# Patient Record
Sex: Male | Born: 1990
Health system: Southern US, Community
[De-identification: ages and names within clinical notes are randomized; demographics above are authoritative.]

## PROBLEM LIST (undated history)

## (undated) DIAGNOSIS — R112 Nausea with vomiting, unspecified: Secondary | ICD-10-CM

## (undated) DIAGNOSIS — K219 Gastro-esophageal reflux disease without esophagitis: Secondary | ICD-10-CM

## (undated) DIAGNOSIS — R109 Unspecified abdominal pain: Secondary | ICD-10-CM

## (undated) DIAGNOSIS — R142 Eructation: Secondary | ICD-10-CM

## (undated) DIAGNOSIS — R197 Diarrhea, unspecified: Secondary | ICD-10-CM

## (undated) HISTORY — DX: Gastro-esophageal reflux disease without esophagitis: K21.9

## (undated) HISTORY — DX: Nausea with vomiting, unspecified: R11.2

## (undated) HISTORY — DX: Eructation: R14.2

## (undated) HISTORY — DX: Unspecified abdominal pain: R10.9

## (undated) HISTORY — DX: Diarrhea, unspecified: R19.7

---

## 2011-04-30 ENCOUNTER — Other Ambulatory Visit: Payer: Self-pay | Admitting: Family Medicine

## 2011-04-30 ENCOUNTER — Ambulatory Visit
Admission: RE | Admit: 2011-04-30 | Discharge: 2011-04-30 | Disposition: A | Discharge status: Home / Self Care | Payer: BC Managed Care – PPO | Source: Ambulatory Visit | Attending: Family Medicine | Admitting: Family Medicine

## 2011-04-30 DIAGNOSIS — N50811 Right testicular pain: Secondary | ICD-10-CM

## 2011-05-06 ENCOUNTER — Encounter (INDEPENDENT_AMBULATORY_CARE_PROVIDER_SITE_OTHER): Payer: Self-pay | Admitting: Surgery

## 2011-05-10 ENCOUNTER — Ambulatory Visit (INDEPENDENT_AMBULATORY_CARE_PROVIDER_SITE_OTHER): Payer: BC Managed Care – PPO | Admitting: Surgery

## 2012-01-22 IMAGING — US US ART/VEN ABD/PELV/SCROTUM DOPPLER LTD
1 series · 14 of 25 positions shown · non-contrast
Comparison: None.

CLINICAL DATA: Right testicular pain

SCROTAL ULTRASOUND
DOPPLER ULTRASOUND OF THE TESTICLES
TECHNIQUE: Complete ultrasound examination of the testicles,
epididymis, and other scrotal structures was performed.  Color and
spectral Doppler ultrasound were also utilized to evaluate blood
flow to the testicles.

[Series 1: us art/ven abd/pelv/scrotum doppler ltd · 14 of 34 slices shown]
[im 1/34]
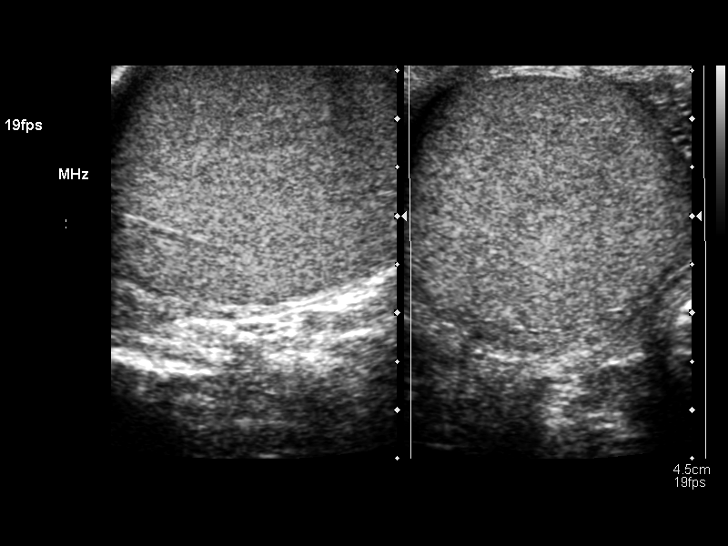
[im 3/34]
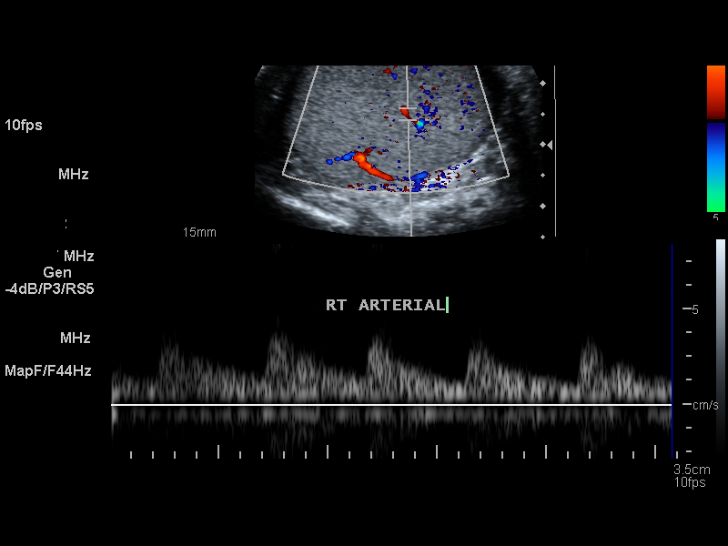
[im 6/34]
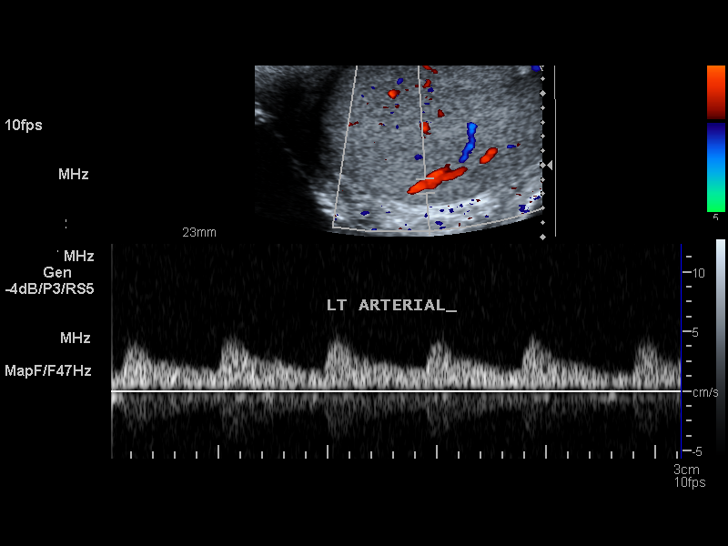
[im 9/34]
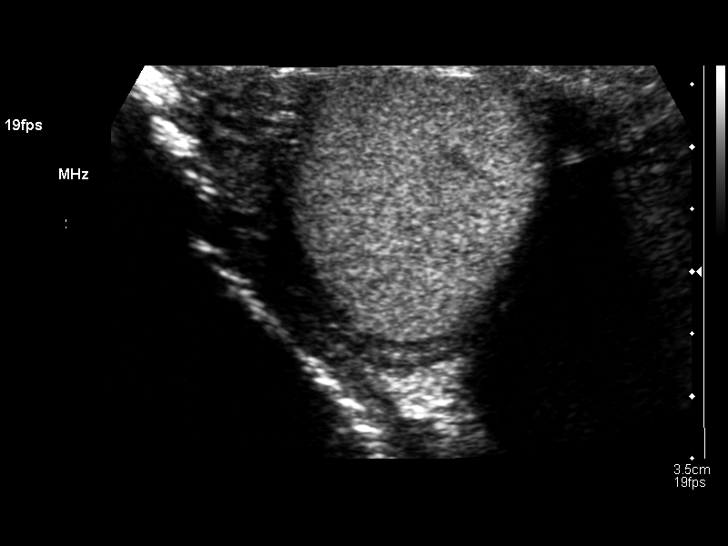
[im 12/34]
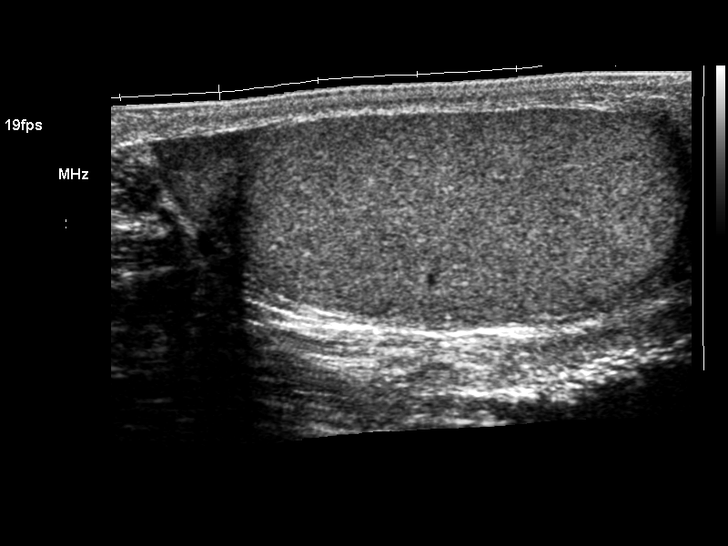
[im 13/34]
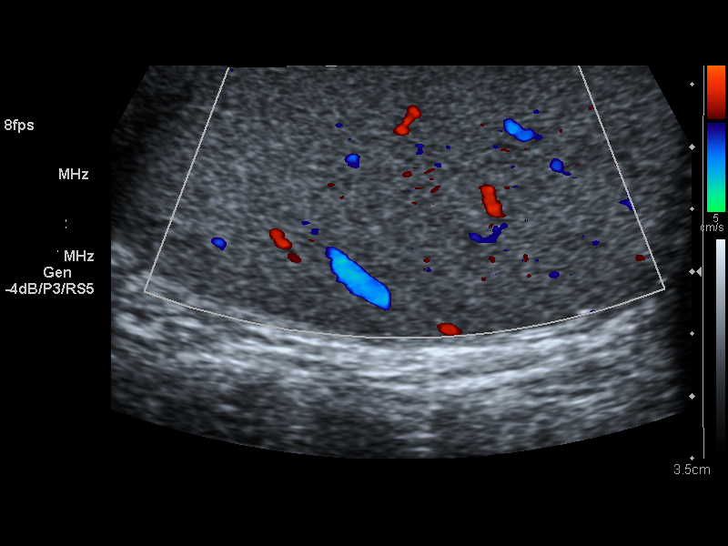
[im 16/34]
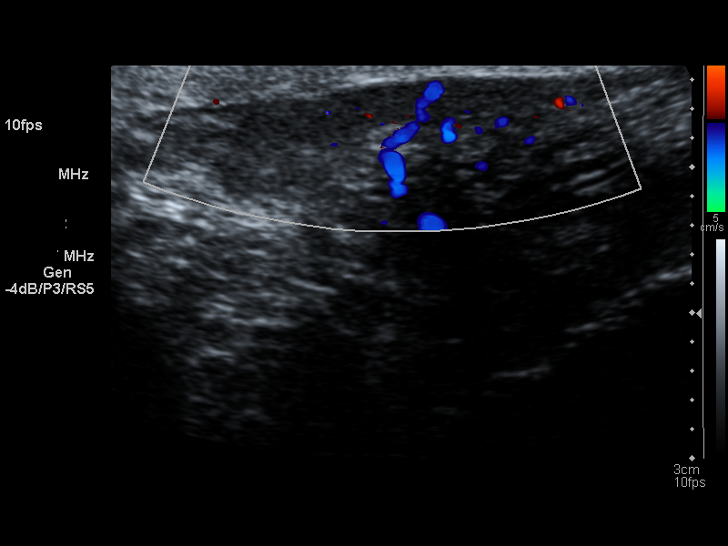
[im 18/34]
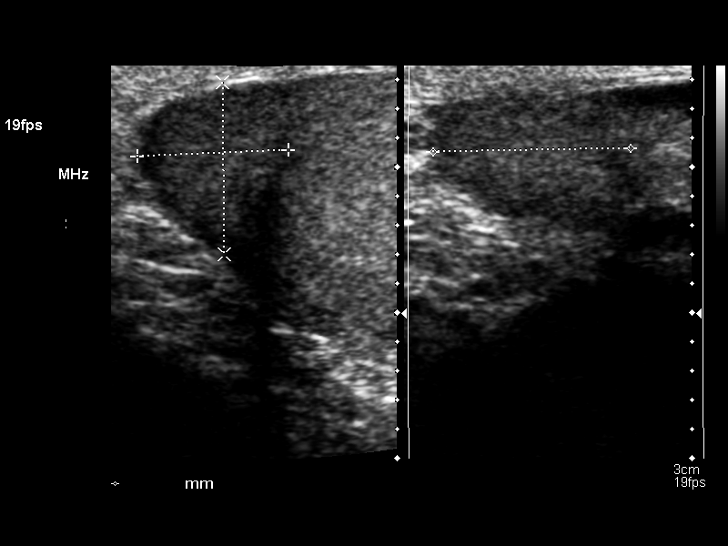
[im 21/34]
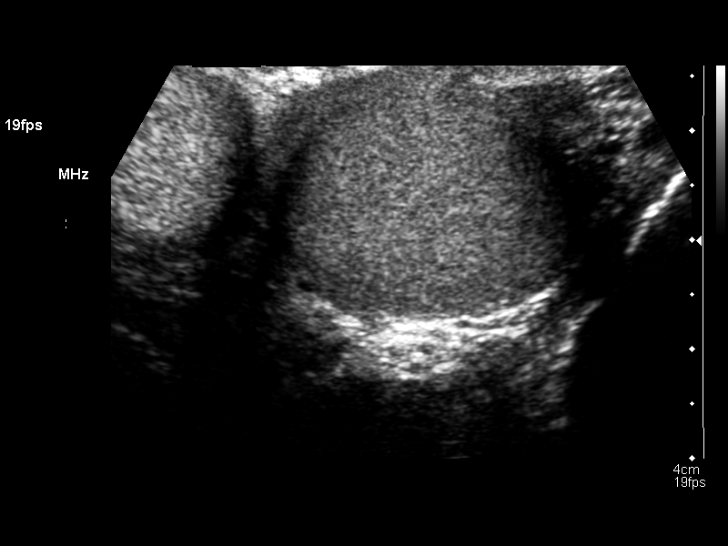
[im 23/34]
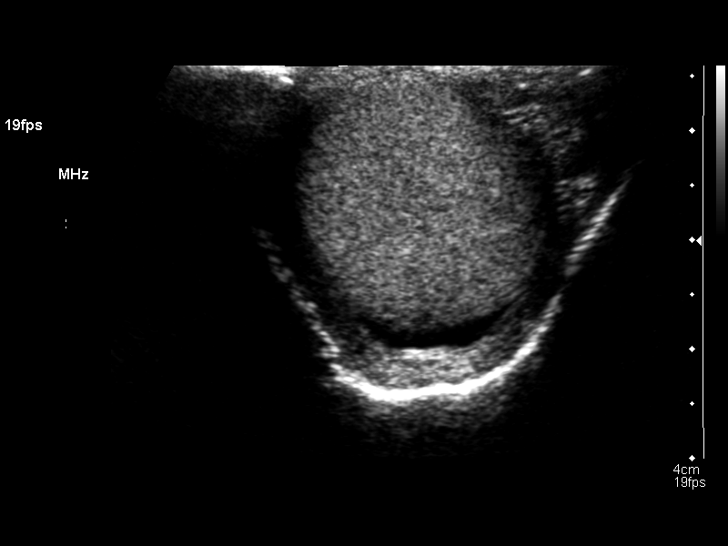
[im 25/34]
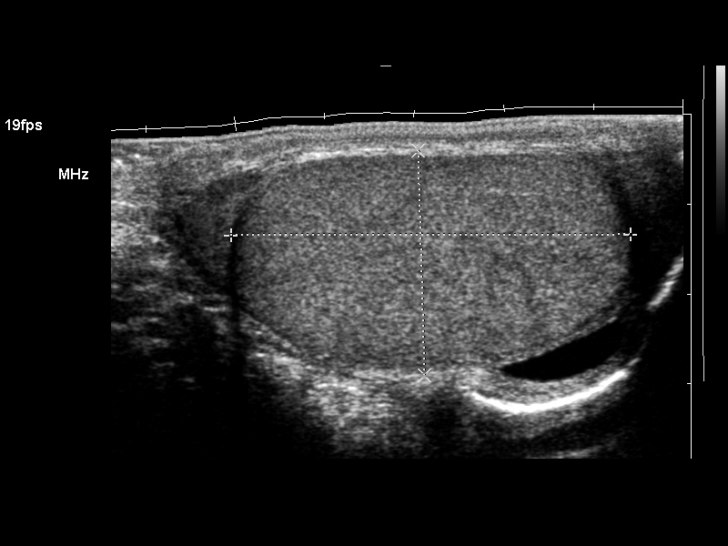
[im 28/34]
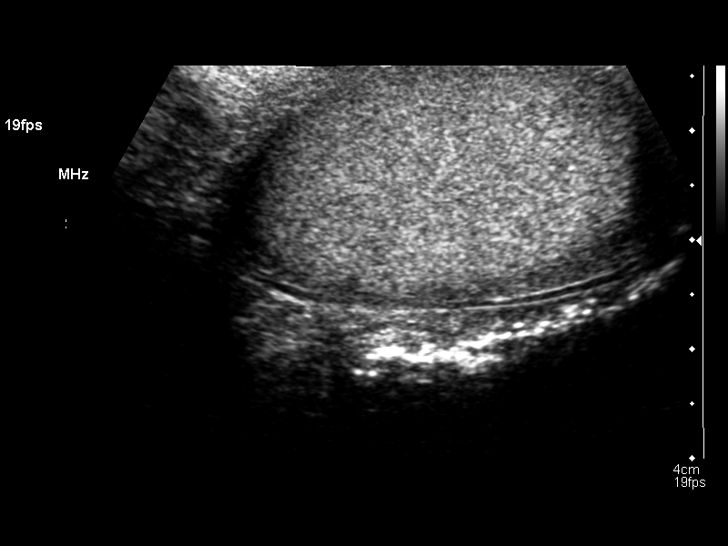
[im 31/34]
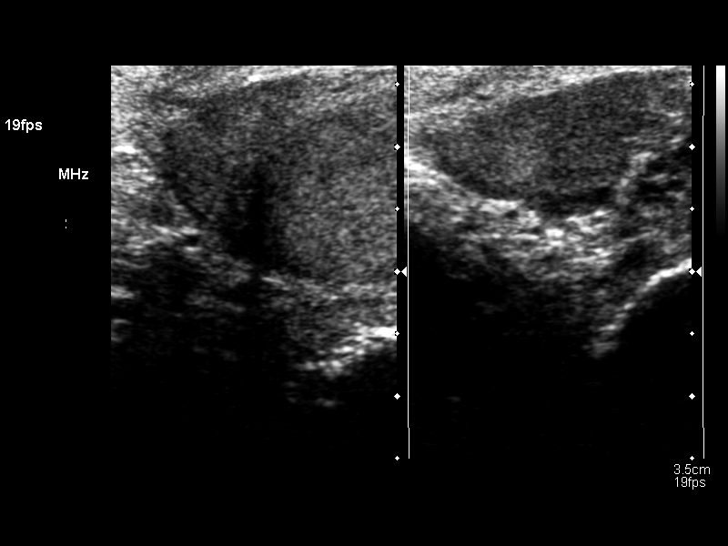
[im 34/34]
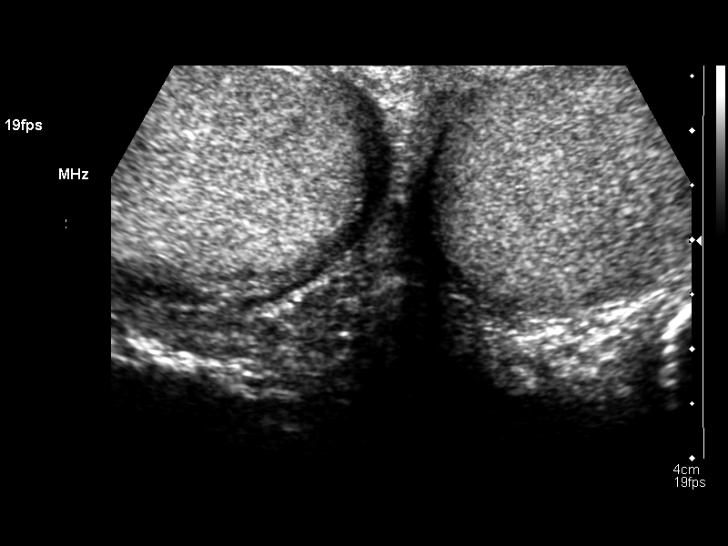

[14 of 25 positions shown; findings below may reference images not displayed]

FINDINGS: Right testis:  Normal in size and echotexture measuring
approximately 4.8 x 2.3 x 3.3 cm.  No focal parenchymal
abnormality.  Normal color Doppler flow without hyperemia

Left testis:  Normal in size and echotexture measuring
approximately 4.5 x 2.5 x 3.2 cm.  No focal parenchymal
abnormality.  Normal color Doppler flow without hyperemia.

Right epididymis:  Normal in size and appearance without hyperemia.

Left epididymis:  Normal in size and appearance without hyperemia.

Hydocele:  Absent.

Varicocele:  Absent.

Pulsed Doppler interrogation of both testes demonstrates normal
arterial and venous waveforms arising from both testicles.
IMPRESSION: Normal scrotal ultrasound.  Specifically, no evidence of testicular
torsion or epididymo-orchitis.

## 2013-03-25 ENCOUNTER — Ambulatory Visit: Payer: Self-pay | Admitting: Family Medicine

## 2013-08-04 ENCOUNTER — Ambulatory Visit: Payer: BC Managed Care – PPO

## 2013-08-04 DIAGNOSIS — Z23 Encounter for immunization: Secondary | ICD-10-CM

## 2014-01-10 ENCOUNTER — Ambulatory Visit (INDEPENDENT_AMBULATORY_CARE_PROVIDER_SITE_OTHER): Payer: BC Managed Care – PPO | Admitting: Family Medicine

## 2014-01-10 VITALS — BP 134/83 | HR 60 | Temp 99.1°F | Ht 69.0 in | Wt 146.4 lb

## 2014-01-10 DIAGNOSIS — K2970 Gastritis, unspecified, without bleeding: Secondary | ICD-10-CM

## 2014-01-10 DIAGNOSIS — K2990 Gastroduodenitis, unspecified, without bleeding: Secondary | ICD-10-CM

## 2014-01-10 MED ORDER — OMEPRAZOLE 20 MG PO CPDR: 20.0000 mg | DELAYED_RELEASE_CAPSULE | Freq: Every day | ORAL | Status: DC

## 2014-01-10 MED ORDER — SUCRALFATE 1 G PO TABS: 1.0000 g | ORAL_TABLET | Freq: Three times a day (TID) | ORAL | Status: DC

## 2014-01-10 NOTE — Progress Notes (Signed)
   Subjective:    Patient ID: Victor CrazeMichael Reynolds, male    DOB: [birth year], 23 y.o.   MRN: [id number]  HPI  This 23 y.o. male presents for evaluation of abdominal discomfort and GERD sx's.  He was rx'd  Some prilosec and he is still having some discomfort.  He states he drinks occasionally and he is trying to quit smoking.  Review of Systems C/o gerd and abdominal pain. No chest pain, SOB, HA, dizziness, vision change, N/V, diarrhea, constipation, dysuria, urinary urgency or frequency, myalgias, arthralgias or rash.     Objective:   Physical Exam Vital signs noted  Well developed well nourished male.  HEENT - Head atraumatic Normocephalic                Eyes - PERRLA, Conjuctiva - clear Sclera- Clear EOMI                Ears - EAC's Wnl TM's Wnl Gross Hearing WNL Respiratory - Lungs CTA bilateral Cardiac - RRR S1 and S2 without murmur GI - Abdomen tender in epigastric region  Extremities - No edema. Neuro - Grossly intact.       Assessment & Plan:  Gastritis - Plan: sucralfate (CARAFATE) 1 G tablet AC and HS for 2 weeks.  Gerd - Prilosec 20mg  one po qd.  Consider GI referral if not better.  Victor CanterWilliam J Shalandra Leu FNP

## 2014-01-12 ENCOUNTER — Other Ambulatory Visit: Payer: Self-pay | Admitting: *Deleted

## 2014-01-12 ENCOUNTER — Encounter: Payer: Self-pay | Admitting: Internal Medicine

## 2014-01-12 DIAGNOSIS — R1013 Epigastric pain: Secondary | ICD-10-CM

## 2014-01-13 ENCOUNTER — Other Ambulatory Visit (INDEPENDENT_AMBULATORY_CARE_PROVIDER_SITE_OTHER): Payer: BC Managed Care – PPO

## 2014-01-13 DIAGNOSIS — R1013 Epigastric pain: Secondary | ICD-10-CM

## 2014-01-13 LAB — POCT CBC
GRANULOCYTE PERCENT: 58.9 % (ref 37–80)
HCT, POC: 41.7 % — AB (ref 43.5–53.7)
Hemoglobin: 13.5 g/dL — AB (ref 14.1–18.1)
Lymph, poc: 3 (ref 0.6–3.4)
MCH, POC: 28.5 pg (ref 27–31.2)
MCHC: 32.4 g/dL (ref 31.8–35.4)
MCV: 87.9 fL (ref 80–97)
MPV: 8.9 fL (ref 0–99.8)
POC GRANULOCYTE: 4.8 (ref 2–6.9)
POC LYMPH %: 36.9 % (ref 10–50)
Platelet Count, POC: 162 10*3/uL (ref 142–424)
RBC: 4.7 M/uL (ref 4.69–6.13)
RDW, POC: 12.5 %
WBC: 8.2 10*3/uL (ref 4.6–10.2)

## 2014-01-13 NOTE — Progress Notes (Signed)
Pt came in for lab  only 

## 2014-01-15 LAB — HEPATIC FUNCTION PANEL
ALT: 18 IU/L (ref 0–44)
AST: 21 IU/L (ref 0–40)
Albumin: 5.1 g/dL (ref 3.5–5.5)
Alkaline Phosphatase: 70 IU/L (ref 39–117)
Bilirubin, Direct: 0.22 mg/dL (ref 0.00–0.40)
TOTAL PROTEIN: 6.9 g/dL (ref 6.0–8.5)
Total Bilirubin: 1.1 mg/dL (ref 0.0–1.2)

## 2014-01-15 LAB — H PYLORI, IGM, IGG, IGA AB: H. pylori, IgA Abs: <9 units (ref 0.0–8.9)

## 2014-01-17 ENCOUNTER — Ambulatory Visit (HOSPITAL_COMMUNITY): Payer: BC Managed Care – PPO

## 2014-01-18 ENCOUNTER — Other Ambulatory Visit: Payer: BC Managed Care – PPO

## 2014-01-18 DIAGNOSIS — Z1212 Encounter for screening for malignant neoplasm of rectum: Secondary | ICD-10-CM

## 2014-01-19 ENCOUNTER — Ambulatory Visit (HOSPITAL_COMMUNITY)
Admission: RE | Admit: 2014-01-19 | Discharge: 2014-01-19 | Disposition: A | Discharge status: Home / Self Care | Payer: BC Managed Care – PPO | Source: Ambulatory Visit | Attending: Family Medicine | Admitting: Family Medicine

## 2014-01-19 DIAGNOSIS — R1013 Epigastric pain: Secondary | ICD-10-CM

## 2014-01-20 LAB — FECAL OCCULT BLOOD, IMMUNOCHEMICAL: Fecal Occult Bld: NEGATIVE

## 2014-01-21 ENCOUNTER — Ambulatory Visit: Payer: BC Managed Care – PPO | Admitting: Family Medicine

## 2014-01-24 ENCOUNTER — Encounter: Payer: Self-pay | Admitting: *Deleted

## 2014-01-24 NOTE — Progress Notes (Signed)
Quick Note:  Copy of labs sent to patient ______ 

## 2014-03-03 ENCOUNTER — Encounter: Payer: Self-pay | Admitting: Internal Medicine

## 2014-03-04 ENCOUNTER — Ambulatory Visit: Payer: BC Managed Care – PPO | Admitting: Internal Medicine

## 2014-08-01 ENCOUNTER — Ambulatory Visit (INDEPENDENT_AMBULATORY_CARE_PROVIDER_SITE_OTHER): Payer: BC Managed Care – PPO | Admitting: Nurse Practitioner

## 2014-08-01 ENCOUNTER — Telehealth: Payer: Self-pay | Admitting: Family Medicine

## 2014-08-01 ENCOUNTER — Encounter: Payer: Self-pay | Admitting: Nurse Practitioner

## 2014-08-01 VITALS — BP 123/77 | HR 62 | Temp 98.7°F | Ht 69.0 in | Wt 146.6 lb

## 2014-08-01 DIAGNOSIS — J019 Acute sinusitis, unspecified: Secondary | ICD-10-CM

## 2014-08-01 MED ORDER — AZITHROMYCIN 250 MG PO TABS: ORAL_TABLET | ORAL | Status: DC

## 2014-08-01 NOTE — Telephone Encounter (Signed)
Appt given for today 

## 2014-08-01 NOTE — Patient Instructions (Signed)

## 2014-08-01 NOTE — Progress Notes (Signed)
   Subjective:    Patient ID: Willia CrazeMichael Chea, male    DOB: [birth year], 23 y.o.   MRN: [id number]  HPI Patient here today c/o cough and congestion that started 2 days ago- OTC meds no good- no fever.    Review of Systems  Constitutional: Negative.  Negative for fever, chills and appetite change.  HENT: Positive for congestion, postnasal drip, rhinorrhea and sinus pressure.   Respiratory: Positive for cough.   Cardiovascular: Negative.   Gastrointestinal: Negative.   Genitourinary: Negative.   Neurological: Negative.   Psychiatric/Behavioral: Negative.   All other systems reviewed and are negative.      Objective:   Physical Exam  Constitutional: He is oriented to person, place, and time. He appears well-developed and well-nourished. He appears distressed.  HENT:  Right Ear: Hearing, tympanic membrane, external ear and ear canal normal.  Left Ear: Hearing, tympanic membrane, external ear and ear canal normal.  Nose: Mucosal edema and rhinorrhea present. Right sinus exhibits maxillary sinus tenderness. Right sinus exhibits no frontal sinus tenderness. Left sinus exhibits maxillary sinus tenderness. Left sinus exhibits no frontal sinus tenderness.  Mouth/Throat: Uvula is midline, oropharynx is clear and moist and mucous membranes are normal.  Eyes: Pupils are equal, round, and reactive to light.  Neck: Normal range of motion. Neck supple.  Cardiovascular: Normal rate.   Pulmonary/Chest: Effort normal and breath sounds normal.  Abdominal: Soft. Bowel sounds are normal.  Neurological: He is alert and oriented to person, place, and time.  Skin: Skin is warm.  Psychiatric: He has a normal mood and affect. His behavior is normal. Judgment and thought content normal.  BP 123/77  Pulse 62  Temp(Src) 98.7 F (37.1 C) (Oral)  Ht 5\' 9"  (1.753 m)  Wt 146 lb 9.6 oz (66.497 kg)  BMI 21.64 kg/m2         Assessment & Plan:   1. Acute rhinosinusitis    Meds ordered this encounter    Medications  . azithromycin (ZITHROMAX Z-PAK) 250 MG tablet    Sig: As directed    Dispense:  6 each    Refill:  0    Order Specific Question:  Supervising Provider    Answer:  Ernestina PennaMOORE, DONALD W [[id number]]   1. Take meds as prescribed 2. Use a cool mist humidifier especially during the winter months and when heat has been humid. 3. Use saline nose sprays frequently 4. Saline irrigations of the nose can be very helpful if done frequently.  * 4X daily for 1 week*  * Use of a nettie pot can be helpful with this. Follow directions with this* 5. Drink plenty of fluids 6. Keep thermostat turn down low 7.For any cough or congestion  Use plain Mucinex- regular strength or max strength is fine   * Children- consult with Pharmacist for dosing 8. For fever or aces or pains- take tylenol or ibuprofen appropriate for age and weight.  * for fevers greater than 101 orally you may alternate ibuprofen and tylenol every  3 hours.   Mary-Margaret Daphine DeutscherMartin, FNP

## 2014-08-15 ENCOUNTER — Ambulatory Visit (INDEPENDENT_AMBULATORY_CARE_PROVIDER_SITE_OTHER): Payer: BC Managed Care – PPO | Admitting: *Deleted

## 2014-08-15 ENCOUNTER — Ambulatory Visit: Payer: BC Managed Care – PPO

## 2014-08-15 DIAGNOSIS — Z23 Encounter for immunization: Secondary | ICD-10-CM

## 2015-05-24 ENCOUNTER — Encounter: Payer: Self-pay | Admitting: *Deleted

## 2015-05-24 ENCOUNTER — Ambulatory Visit: Payer: Self-pay | Admitting: Family Medicine

## 2015-05-24 ENCOUNTER — Encounter (INDEPENDENT_AMBULATORY_CARE_PROVIDER_SITE_OTHER): Payer: Self-pay

## 2015-08-01 ENCOUNTER — Ambulatory Visit (INDEPENDENT_AMBULATORY_CARE_PROVIDER_SITE_OTHER): Payer: BLUE CROSS/BLUE SHIELD | Admitting: *Deleted

## 2015-08-01 DIAGNOSIS — Z23 Encounter for immunization: Secondary | ICD-10-CM | POA: Diagnosis not present

## 2015-09-04 ENCOUNTER — Encounter (INDEPENDENT_AMBULATORY_CARE_PROVIDER_SITE_OTHER): Payer: Self-pay

## 2015-09-04 ENCOUNTER — Encounter: Payer: Self-pay | Admitting: Family Medicine

## 2015-09-04 ENCOUNTER — Ambulatory Visit (INDEPENDENT_AMBULATORY_CARE_PROVIDER_SITE_OTHER): Payer: BLUE CROSS/BLUE SHIELD | Admitting: Family Medicine

## 2015-09-04 VITALS — BP 122/76 | HR 64 | Temp 97.3°F | Ht 69.0 in | Wt 150.2 lb

## 2015-09-04 DIAGNOSIS — J0100 Acute maxillary sinusitis, unspecified: Secondary | ICD-10-CM | POA: Insufficient documentation

## 2015-09-04 DIAGNOSIS — J0101 Acute recurrent maxillary sinusitis: Secondary | ICD-10-CM

## 2015-09-04 MED ORDER — AMOXICILLIN-POT CLAVULANATE 875-125 MG PO TABS: 1.0000 | ORAL_TABLET | Freq: Two times a day (BID) | ORAL | Status: DC

## 2015-09-04 MED ORDER — MOMETASONE FUROATE 50 MCG/ACT NA SUSP: 2.0000 | Freq: Every day | NASAL | Status: DC

## 2015-09-04 NOTE — Patient Instructions (Signed)
Great to meet you!  Come back if you get worse or don't get better as expected  Eat yogurt daily with this medicine.   Sinusitis, Adult Sinusitis is redness, soreness, and puffiness (inflammation) of the air pockets in the bones of your face (sinuses). The redness, soreness, and puffiness can cause air and mucus to get trapped in your sinuses. This can allow germs to grow and cause an infection.  HOME CARE   Drink enough fluids to keep your pee (urine) clear or pale yellow.  Use a humidifier in your home.  Run a hot shower to create steam in the bathroom. Sit in the bathroom with the door closed. Breathe in the steam 3-4 times a day.  Put a warm, moist washcloth on your face 3-4 times a day, or as told by your doctor.  Use salt water sprays (saline sprays) to wet the thick fluid in your nose. This can help the sinuses drain.  Only take medicine as told by your doctor. GET HELP RIGHT AWAY IF:   Your pain gets worse.  You have very bad headaches.  You are sick to your stomach (nauseous).  You throw up (vomit).  You are very sleepy (drowsy) all the time.  Your face is puffy (swollen).  Your vision changes.  You have a stiff neck.  You have trouble breathing. MAKE SURE YOU:   Understand these instructions.  Will watch your condition.  Will get help right away if you are not doing well or get worse.   This information is not intended to replace advice given to you by your health care provider. Make sure you discuss any questions you have with your health care provider.   Document Released: 03/11/2008 Document Revised: 10/14/2014 Document Reviewed: 04/28/2012 Elsevier Interactive Patient Education [employer]2016 Elsevier Inc.

## 2015-09-04 NOTE — Progress Notes (Signed)
   HPI  Patient presents today . Acute illness, concern for sinus infection.  Patient explains he has had 3 days of cough, congestion, sinus pressure, and headache. He states he gets these approximately once a year over the last 3-4 years. He has mild dyspnea He does have malaise No fevers He has a normal appetite and oral tolerance. He missed work today due to malaise  PMH: Smoking status noted ROS: Per HPI  Objective: BP 122/76 mmHg  Pulse 64  Temp(Src) 97.3 F (36.3 C) (Oral)  Ht 5\' 9"  (1.753 m)  Wt 150 lb 3.2 oz (68.13 kg)  BMI 22.17 kg/m2 Gen: NAD, alert, cooperative with exam HEENT: NCAT, TMs normal bilaterally, nares with some swelling, oropharynx clear, some tenderness to palpation of bilateral maxillary sinuses Neck: A few small nontender swollen lymph nodes CV: RRR, good S1/S2, no murmur Resp:  Expiratory wheezes Ext: No edema, warm Neuro: Alert and oriented, No gross deficits  Assessment and plan:  # Acute maxillary sinusitis treating with Augmentin Also start Flonase to prevent recurrence Discussed supportive care, note written for work excuse    Meds ordered this encounter  Medications  . amoxicillin-clavulanate (AUGMENTIN) 875-125 MG tablet    Sig: Take 1 tablet by mouth 2 (two) times daily.    Dispense:  20 tablet    Refill:  0  . mometasone (NASONEX) 50 MCG/ACT nasal spray    Sig: Place 2 sprays into the nose daily.    Dispense:  17 g    Refill:  12    Murtis SinkSam Kansas Spainhower, MD [facility] 09/04/2015, 4:52 PM

## 2016-10-03 ENCOUNTER — Ambulatory Visit (INDEPENDENT_AMBULATORY_CARE_PROVIDER_SITE_OTHER): Payer: BLUE CROSS/BLUE SHIELD | Admitting: Pediatrics

## 2016-10-03 ENCOUNTER — Encounter: Payer: Self-pay | Admitting: Pediatrics

## 2016-10-03 VITALS — BP 129/75 | HR 82 | Temp 97.7°F | Ht 69.0 in | Wt 144.2 lb

## 2016-10-03 DIAGNOSIS — K529 Noninfective gastroenteritis and colitis, unspecified: Secondary | ICD-10-CM | POA: Diagnosis not present

## 2016-10-03 NOTE — Progress Notes (Signed)
  Subjective:   Patient ID: Victor Reynolds, male    DOB: September 16, 1991, 25 y.o.   MRN: [id number] CC: GI upset (started last PM, vomitting some diarrhea, feels much better now)  HPI: Victor CrazeMichael Decarlo is a 25 y.o. male presenting for GI upset (started last PM, vomitting some diarrhea, feels much better now)  Feeling normal now Needs note for work Started getting sick last night Last time vomited about 5am this morning, total 3-4 times No blood, light yellow phlegm Just one loose stool, normal brown, had normal stool today Eating and tolerating food fine now No other sick contacts Not eaten any unusual foods Does not work with food  Relevant past medical, surgical, family and social history reviewed. Allergies and medications reviewed and updated. History  Smoking Status  . Current Every Day Smoker  Smokeless Tobacco  . Not on file   ROS: Per HPI   Objective:    BP 129/75 (BP Location: Left Arm, Patient Position: Sitting, Cuff Size: Normal)   Pulse 82   Temp 97.7 F (36.5 C) (Oral)   Ht 5\' 9"  (1.753 m)   Wt 144 lb 3.2 oz (65.4 kg)   BMI 21.29 kg/m   Wt Readings from Last 3 Encounters:  10/03/16 144 lb 3.2 oz (65.4 kg)  09/04/15 150 lb 3.2 oz (68.1 kg)  08/01/14 146 lb 9.6 oz (66.5 kg)    Gen: NAD, alert, cooperative with exam, NCAT EYES: EOMI, no conjunctival injection, or no icterus CV: NRRR, normal S1/S2, no murmur, distal pulses 2+ b/l Resp: CTABL, no wheezes, normal WOB Abd: +BS, soft, NTND. no guarding or organomegaly Ext: No edema, warm Neuro: Alert and oriented, strength equal b/l UE and LE, coordination grossly normal MSK: normal muscle bulk  Assessment & Plan:  Casimiro NeedleMichael was seen today for gi upset.  Diagnoses and all orders for this visit:  Gastroenteritis Feels back to normal self Gave note for work Let us know if sx return  Follow up plan: prn Rex Krasarol Vincent, MD [facility]

## 2017-02-19 ENCOUNTER — Ambulatory Visit: Payer: BLUE CROSS/BLUE SHIELD | Admitting: Pediatrics

## 2017-03-07 ENCOUNTER — Ambulatory Visit (INDEPENDENT_AMBULATORY_CARE_PROVIDER_SITE_OTHER): Payer: BLUE CROSS/BLUE SHIELD | Admitting: Family Medicine

## 2017-03-07 ENCOUNTER — Encounter: Payer: Self-pay | Admitting: Family Medicine

## 2017-03-07 VITALS — BP 113/82 | HR 85 | Temp 98.2°F | Ht 69.0 in | Wt 135.0 lb

## 2017-03-07 DIAGNOSIS — A084 Viral intestinal infection, unspecified: Secondary | ICD-10-CM

## 2017-03-07 MED ORDER — ONDANSETRON 4 MG PO TBDP: 4.0000 mg | ORAL_TABLET | Freq: Three times a day (TID) | ORAL | 0 refills | Status: DC | PRN

## 2017-03-07 NOTE — Progress Notes (Signed)
BP 113/82   Pulse 85   Temp 98.2 F (36.8 C) (Oral)   Ht 5\' 9"  (1.753 m)   Wt 135 lb (61.2 kg)   BMI 19.94 kg/m    Subjective:    Patient ID: Victor Reynolds, male    DOB: 11/27/1990, 26 y.o.   MRN: [id number]  HPI: Victor Reynolds is a 26 y.o. male presenting on 03/07/2017 for Vomiting (last night; had eaten some greasy food several hours earlier)   HPI Nausea and vomiting and abdominal pain Patient woke up last night around 0100 with nausea and vomiting and abdominal pain. Last night he did eat at the bar and had a couple drinks but nothing more than he usually does and he had a [place] cheese steak which she has had before at a bar that he goes frequently. He denies anybody else getting ill or sick from the bar. He denies any sick contacts from work that he knows of either. He saw some red chunks in his vomit and was concerned that they might be blood but he has not had any vomiting yet since but has been more fatigued and sleepy all day today. He has had a looser stool but no diarrhea and denies any blood in his stool.  Relevant past medical, surgical, family and social history reviewed and updated as indicated. Interim medical history since our last visit reviewed. Allergies and medications reviewed and updated.  Review of Systems  Constitutional: Negative for chills and fever.  Respiratory: Negative for shortness of breath and wheezing.   Cardiovascular: Negative for chest pain and leg swelling.  Gastrointestinal: Positive for abdominal pain, nausea and vomiting. Negative for anal bleeding, blood in stool, constipation and diarrhea.  Musculoskeletal: Negative for back pain and gait problem.  Skin: Negative for rash.  All other systems reviewed and are negative.   Per HPI unless specifically indicated above        Objective:    BP 113/82   Pulse 85   Temp 98.2 F (36.8 C) (Oral)   Ht 5\' 9"  (1.753 m)   Wt 135 lb (61.2 kg)   BMI 19.94 kg/m   Wt Readings from Last 3  Encounters:  03/07/17 135 lb (61.2 kg)  10/03/16 144 lb 3.2 oz (65.4 kg)  09/04/15 150 lb 3.2 oz (68.1 kg)    Physical Exam  Constitutional: He is oriented to person, place, and time. He appears well-developed and well-nourished. No distress.  Eyes: Conjunctivae are normal. No scleral icterus.  Cardiovascular: Normal rate, regular rhythm, normal heart sounds and intact distal pulses.   No murmur heard. Pulmonary/Chest: Effort normal and breath sounds normal. No respiratory distress. He has no wheezes. He has no rales.  Abdominal: Soft. Bowel sounds are normal. He exhibits no distension. There is no hepatosplenomegaly. There is tenderness in the epigastric area. There is no rigidity, no rebound, no guarding and no CVA tenderness.  Musculoskeletal: Normal range of motion. He exhibits no edema.  Neurological: He is alert and oriented to person, place, and time. Coordination normal.  Skin: Skin is warm and dry. No rash noted. He is not diaphoretic.  Psychiatric: He has a normal mood and affect. His behavior is normal.  Nursing note and vitals reviewed.       Assessment & Plan:   Problem List Items Addressed This Visit    None    Visit Diagnoses    Viral gastroenteritis    -  Primary   Relevant Medications   ondansetron (  ZOFRAN ODT) 4 MG disintegrating tablet       Follow up plan: Return if symptoms worsen or fail to improve.  Counseling provided for all of the vaccine components No orders of the defined types were placed in this encounter.   Arville CareJoshua Dettinger, MD [facility] 03/07/2017, 4:19 PM

## 2018-04-15 ENCOUNTER — Other Ambulatory Visit: Payer: Self-pay

## 2018-04-15 ENCOUNTER — Ambulatory Visit: Payer: BLUE CROSS/BLUE SHIELD | Admitting: Pediatrics

## 2018-04-15 ENCOUNTER — Encounter: Payer: Self-pay | Admitting: Pediatrics

## 2018-04-15 VITALS — BP 124/81 | HR 79 | Temp 97.8°F | Ht 69.0 in | Wt 145.0 lb

## 2018-04-15 DIAGNOSIS — K047 Periapical abscess without sinus: Secondary | ICD-10-CM

## 2018-04-15 DIAGNOSIS — L0390 Cellulitis, unspecified: Secondary | ICD-10-CM

## 2018-04-15 DIAGNOSIS — R55 Syncope and collapse: Secondary | ICD-10-CM | POA: Diagnosis not present

## 2018-04-15 MED ORDER — CLINDAMYCIN HCL 300 MG PO CAPS: 300.0000 mg | ORAL_CAPSULE | Freq: Four times a day (QID) | ORAL | 0 refills | Status: DC

## 2018-04-15 NOTE — Progress Notes (Signed)
Subjective:   Patient ID: Victor Reynolds, male    DOB: 04/07/1991, 27 y.o.   MRN: [id number] CC: Right jaw pain; ? cyst to lower back; Feels tired; and Passed out Saturday  HPI: Victor Reynolds is a 27 y.o. male   4 days ago he was drinking alcohol friends, and had about 3 drinks.  They were outside in the heat.  He stood up quickly, felt lightheaded and then fainted, hitting his head.  He thinks he returned to consciousness immediately.  He denies any other substance use at the time.  No chest tightness, palpitations before the event.  He used to have similar events when he was much younger.  He was evaluated by multiple specialist at that time with no etiology found.  Usually has no trouble with exercise tolerance.  Right lower molar started bothering him about a week ago.  Is very tender now.  He has a cyst at the top of his gluteal cleft that is, and gone with swelling over the last few years.  One point he did have to have it opened and drained.  It is slightly tender now when he sits on it.  Minimal redness.  Has not had any drainage from it recently.  Relevant past medical, surgical, family and social history reviewed. Allergies and medications reviewed and updated. Social History   Tobacco Use  Smoking Status Current Every Day Smoker  . Packs/day: 0.50  . Types: Cigarettes  Smokeless Tobacco Never Used   ROS: Per HPI   Objective:    BP 124/81   Pulse 79   Temp 97.8 F (36.6 C) (Oral)   Ht 5' 9"  (1.753 m)   Wt 145 lb (65.8 kg)   BMI 21.41 kg/m   Wt Readings from Last 3 Encounters:  04/15/18 145 lb (65.8 kg)  03/07/17 135 lb (61.2 kg)  10/03/16 144 lb 3.2 oz (65.4 kg)    Gen: NAD, alert, cooperative with exam, NCAT EYES: EOMI, no conjunctival injection, or no icterus ENT:  TMs pearly gray b/l, OP without erythema, tender to palpation along first molar right lower jaw buccal side. LYMPH: no cervical LAD CV: NRRR, normal S1/S2, no murmur, distal pulses 2+ b/l Resp:  CTABL, no wheezes, normal WOB Ext: No edema, warm Neuro: Alert and oriented, strength equal b/l UE and LE, coordination grossly normal, CN III-XII intact Skin: Approximately 3 cm of soft nodule under the skin left upper buttock, just proximal to the gluteal cleft.  Possible fluctuance.  About 1 cm redness at the proximal portion.  Tender to palpation.   Assessment & Plan:  Victor Reynolds was seen today for right jaw pain, ? cyst to lower back, feels tired and passed out saturday.  Diagnoses and all orders for this visit:  Tooth infection Start treatment with below.  Patient needs to contact dentist for appointment. -     clindamycin (CLEOCIN) 300 MG capsule; Take 1 capsule (300 mg total) by mouth 4 (four) times daily.  Syncope, unspecified syncope type Normal neuro exam today.  Preceding lightheadedness, alcohol use at the time.  EKG normal sinus rhythm.  Suspect vasovagal syncope.  Encouraged adequate hydration, minimizing alcohol use.  Also will treat current infections that were likely developing at the time.  Return precautions discussed at length. -     EKG 12-Lead -     CMP14+EGFR -     CBC with Differential/Platelet  ?Cellulitis lower back Treated with clindamycin as above.  If does not continue to improve patient  is to return, may need I&D.  Follow up plan: Return in about 1 month (around 05/13/2018).  Sooner if needed Assunta Found, MD [place]

## 2018-11-10 ENCOUNTER — Ambulatory Visit (INDEPENDENT_AMBULATORY_CARE_PROVIDER_SITE_OTHER): Payer: Self-pay | Admitting: Family Medicine

## 2018-11-10 ENCOUNTER — Encounter: Payer: Self-pay | Admitting: Family Medicine

## 2018-11-10 VITALS — BP 127/82 | HR 92 | Temp 98.9°F | Ht 69.0 in | Wt 170.8 lb

## 2018-11-10 DIAGNOSIS — J020 Streptococcal pharyngitis: Secondary | ICD-10-CM

## 2018-11-10 DIAGNOSIS — R6889 Other general symptoms and signs: Secondary | ICD-10-CM

## 2018-11-10 MED ORDER — AMOXICILLIN 500 MG PO CAPS: 500.0000 mg | ORAL_CAPSULE | Freq: Two times a day (BID) | ORAL | 0 refills | Status: DC

## 2018-11-10 NOTE — Progress Notes (Signed)
BP 127/82   Pulse 92   Temp 98.9 F (37.2 C) (Oral)   Ht 5\' 9"  (1.753 m)   Wt 170 lb 12.8 oz (77.5 kg)   BMI 25.22 kg/m    Subjective:    Patient ID: Victor Reynolds, male    DOB: 18-Jun-1991, 28 y.o.   MRN: [id number]  HPI: Victor Reynolds is a 28 y.o. male presenting on 11/10/2018 for No chief complaint on file.   HPI Sore throat and congestion and fever Patient comes in today with sore throat and congestion and fever.  He says that this is been going on for the past couple days and he feels like it is getting worse and that he is now having body aches associated with it as well.  His sore throat is been worsening.  He denies any shortness of breath or wheezing.  His fever has felt high but has not taken the temperature and it is 98.9 here in the office.  He is used some Tylenol to help bring the fever down but has not used anything else over-the-counter at this point.  He denies any sick contacts that he knows of.  Her friend is with him here today and she has not been ill either.  Relevant past medical, surgical, family and social history reviewed and updated as indicated. Interim medical history since our last visit reviewed. Allergies and medications reviewed and updated.  Review of Systems  Constitutional: Negative for chills and fever.  HENT: Positive for postnasal drip, rhinorrhea, sinus pressure and sore throat. Negative for congestion, ear discharge, ear pain, sneezing and voice change.   Eyes: Negative for pain, discharge, redness and visual disturbance.  Respiratory: Negative for shortness of breath and wheezing.   Cardiovascular: Negative for chest pain and leg swelling.  Musculoskeletal: Negative for back pain and gait problem.  Skin: Negative for rash.  All other systems reviewed and are negative.   Per HPI unless specifically indicated above   Allergies as of 11/10/2018   No Known Allergies     Medication List       Accurate as of November 10, 2018  6:04 PM.  Always use your most recent med list.        amoxicillin 500 MG capsule Commonly known as:  AMOXIL Take 1 capsule (500 mg total) by mouth 2 (two) times daily.          Objective:    BP 127/82   Pulse 92   Temp 98.9 F (37.2 C) (Oral)   Ht 5\' 9"  (1.753 m)   Wt 170 lb 12.8 oz (77.5 kg)   BMI 25.22 kg/m   Wt Readings from Last 3 Encounters:  11/10/18 170 lb 12.8 oz (77.5 kg)  04/15/18 145 lb (65.8 kg)  03/07/17 135 lb (61.2 kg)    Physical Exam Vitals signs and nursing note reviewed.  Constitutional:      General: He is not in acute distress.    Appearance: He is well-developed. He is not diaphoretic.  HENT:     Right Ear: Tympanic membrane, ear canal and external ear normal.     Left Ear: Tympanic membrane, ear canal and external ear normal.     Nose: Mucosal edema and rhinorrhea present.     Right Sinus: No maxillary sinus tenderness or frontal sinus tenderness.     Left Sinus: No maxillary sinus tenderness or frontal sinus tenderness.     Mouth/Throat:     Pharynx: Uvula midline. Pharyngeal swelling and  posterior oropharyngeal erythema present. No oropharyngeal exudate.     Tonsils: No tonsillar abscesses.  Eyes:     General: No scleral icterus.       Right eye: No discharge.     Conjunctiva/sclera: Conjunctivae normal.     Pupils: Pupils are equal, round, and reactive to light.  Neck:     Musculoskeletal: Neck supple.     Thyroid: No thyromegaly.  Cardiovascular:     Rate and Rhythm: Normal rate and regular rhythm.     Heart sounds: Normal heart sounds. No murmur.  Pulmonary:     Effort: Pulmonary effort is normal. No respiratory distress.     Breath sounds: Normal breath sounds. No wheezing or rales.  Musculoskeletal: Normal range of motion.  Lymphadenopathy:     Cervical: No cervical adenopathy.  Skin:    General: Skin is warm and dry.     Findings: No rash.  Neurological:     Mental Status: He is alert and oriented to person, place, and time.      Coordination: Coordination normal.  Psychiatric:        Behavior: Behavior normal.        Assessment & Plan:   Problem List Items Addressed This Visit    None    Visit Diagnoses    Strep pharyngitis    -  Primary   Relevant Medications   amoxicillin (AMOXIL) 500 MG capsule   Flu-like symptoms       Relevant Orders   Rapid Strep Screen ([facility] ONLY)   Veritor Flu A/B Waived       Follow up plan: Return if symptoms worsen or fail to improve.  Counseling provided for all of the vaccine components Orders Placed This Encounter  Procedures  . Rapid Strep Screen ([facility] ONLY)  . Veritor Flu A/B Waived    Arville Care, MD [facility] 11/10/2018, 6:04 PM

## 2018-11-11 LAB — RAPID STREP SCREEN (MED CTR MEBANE ONLY): STREP GP A AG, IA W/REFLEX: POSITIVE — AB

## 2018-11-11 LAB — VERITOR FLU A/B WAIVED
Influenza A: NEGATIVE
Influenza B: NEGATIVE

## 2018-11-25 ENCOUNTER — Ambulatory Visit (INDEPENDENT_AMBULATORY_CARE_PROVIDER_SITE_OTHER): Payer: Self-pay | Admitting: Family Medicine

## 2018-11-25 ENCOUNTER — Encounter: Payer: Self-pay | Admitting: Family Medicine

## 2018-11-25 VITALS — BP 120/75 | HR 56 | Temp 97.6°F | Ht 69.0 in | Wt 174.8 lb

## 2018-11-25 DIAGNOSIS — R197 Diarrhea, unspecified: Secondary | ICD-10-CM

## 2018-11-25 NOTE — Progress Notes (Signed)
BP 120/75   Pulse (!) 56   Temp 97.6 F (36.4 C) (Oral)   Ht 5\' 9"  (1.753 m)   Wt 174 lb 12.8 oz (79.3 kg)   BMI 25.81 kg/m    Subjective:    Patient ID: Victor Reynolds, male    DOB: 25-Jul-1991, 28 y.o.   MRN: [id number]  HPI: Victor Reynolds is a 28 y.o. male presenting on 11/25/2018 for Diarrhea (x 1 month )   HPI Patient comes in complaining of diarrhea this been going on for over a month.  He says that it all started after he was treated with an antibiotic for strep and he had diarrhea while on the antibiotic but that has continued to have diarrhea since that time.  Patient denies any blood in his stools or fevers or chills.  He says that he has had low-grade mild lower abdominal pain but nothing worse than that.  He rates it as a 3 out of 10.  He says the abdominal pain will be better after bowel movements.  He says he does have some normal bowel movements each day but also has the diarrhea as well.  He denies any radiation of the pain anywhere else or flank pain or dysuria.  He did have some nausea initially but has not really had any since.  He says he will have 3-4 loose to watery stools per day.  Relevant past medical, surgical, family and social history reviewed and updated as indicated. Interim medical history since our last visit reviewed. Allergies and medications reviewed and updated.  Review of Systems  Constitutional: Negative for chills and fever.  Respiratory: Negative for shortness of breath and wheezing.   Cardiovascular: Negative for chest pain and leg swelling.  Gastrointestinal: Positive for abdominal pain, diarrhea and nausea. Negative for anal bleeding, blood in stool, constipation, rectal pain and vomiting.  Genitourinary: Negative for dysuria and frequency.  Musculoskeletal: Negative for back pain and gait problem.  Skin: Negative for rash.  All other systems reviewed and are negative.   Per HPI unless specifically indicated above   Allergies as of  11/25/2018   No Known Allergies     Medication List    as of November 25, 2018 11:07 AM   You have not been prescribed any medications.        Objective:    BP 120/75   Pulse (!) 56   Temp 97.6 F (36.4 C) (Oral)   Ht 5\' 9"  (1.753 m)   Wt 174 lb 12.8 oz (79.3 kg)   BMI 25.81 kg/m   Wt Readings from Last 3 Encounters:  11/25/18 174 lb 12.8 oz (79.3 kg)  11/10/18 170 lb 12.8 oz (77.5 kg)  04/15/18 145 lb (65.8 kg)    Physical Exam Vitals signs and nursing note reviewed.  Constitutional:      General: He is not in acute distress.    Appearance: He is well-developed. He is not diaphoretic.  Eyes:     General: No scleral icterus.    Conjunctiva/sclera: Conjunctivae normal.  Neck:     Musculoskeletal: Neck supple.     Thyroid: No thyromegaly.  Cardiovascular:     Rate and Rhythm: Normal rate and regular rhythm.     Heart sounds: Normal heart sounds. No murmur.  Pulmonary:     Effort: Pulmonary effort is normal. No respiratory distress.     Breath sounds: Normal breath sounds. No wheezing.  Abdominal:     General: Abdomen is flat. Bowel  sounds are normal. There is no distension.     Tenderness: There is abdominal tenderness (Mild periumbilical tenderness). There is no guarding or rebound.  Musculoskeletal: Normal range of motion.  Lymphadenopathy:     Cervical: No cervical adenopathy.  Skin:    General: Skin is warm and dry.     Findings: No rash.  Neurological:     Mental Status: He is alert and oriented to person, place, and time.     Coordination: Coordination normal.  Psychiatric:        Behavior: Behavior normal.         Assessment & Plan:   Problem List Items Addressed This Visit    None    Visit Diagnoses    Diarrhea of presumed infectious origin    -  Primary   Relevant Orders   Cdiff NAA+O+P+Stool Culture   Clostridium difficile EIA    Recommended a daily probiotic and Imodium and to return if he wants the stool study, but self-pay so he did  not want to do it today.  Avoid spicy foods as well  Follow up plan: Return if symptoms worsen or fail to improve.  Counseling provided for all of the vaccine components Orders Placed This Encounter  Procedures  . Cdiff NAA+O+P+Stool Culture  . Clostridium difficile EIA    Arville Care, MD [facility] 11/25/2018, 11:07 AM

## 2020-01-17 ENCOUNTER — Encounter: Payer: Self-pay | Admitting: Family Medicine

## 2020-01-17 ENCOUNTER — Telehealth (INDEPENDENT_AMBULATORY_CARE_PROVIDER_SITE_OTHER): Payer: BC Managed Care – PPO | Admitting: Family Medicine

## 2020-01-17 DIAGNOSIS — K529 Noninfective gastroenteritis and colitis, unspecified: Secondary | ICD-10-CM

## 2020-01-17 DIAGNOSIS — K279 Peptic ulcer, site unspecified, unspecified as acute or chronic, without hemorrhage or perforation: Secondary | ICD-10-CM

## 2020-01-17 MED ORDER — ONDANSETRON 8 MG PO TBDP: 8.0000 mg | ORAL_TABLET | Freq: Four times a day (QID) | ORAL | 1 refills | Status: DC | PRN

## 2020-01-17 MED ORDER — PANTOPRAZOLE SODIUM 40 MG PO TBEC: 40.0000 mg | DELAYED_RELEASE_TABLET | Freq: Every day | ORAL | 11 refills | Status: DC

## 2020-01-17 NOTE — Progress Notes (Signed)
    Subjective:    Patient ID: Victor Reynolds, male    DOB: 1990-12-01, 29 y.o.   MRN: [id number]   HPI: Victor Reynolds is a 29 y.o. male presenting for 3 days of awakening at 12-1 AM vomits his dinner. At at 6 PM yesterday. Energy is poor. Feels dehydrated. Stools are loose. Stool every two hours or so. Denies fever.Burning up last night. Kept heat at 62 degrees overnight.    Depression screen [facility] 2/9 11/25/2018 04/15/2018 03/07/2017  Decreased Interest 0 2 0  Down, Depressed, Hopeless 0 2 0  PHQ - 2 Score 0 4 0  Altered sleeping - 2 -  Tired, decreased energy - 2 -  Change in appetite - 0 -  Feeling bad or failure about yourself  - 0 -  Trouble concentrating - 0 -  Moving slowly or fidgety/restless - 0 -  Suicidal thoughts - 0 -  PHQ-9 Score - 8 -     Relevant past medical, surgical, family and social history reviewed and updated as indicated.  Interim medical history since our last visit reviewed. Allergies and medications reviewed and updated.  ROS:  Review of Systems  Constitutional: Negative for chills, diaphoresis, fever and unexpected weight change.  HENT: Negative for rhinorrhea and trouble swallowing.   Respiratory: Negative for cough, chest tightness and shortness of breath.   Cardiovascular: Negative for chest pain.  Gastrointestinal: Positive for abdominal pain. Negative for abdominal distention, blood in stool, constipation, diarrhea, nausea, rectal pain and vomiting.  Genitourinary: Negative for dysuria, flank pain and hematuria.  Musculoskeletal: Negative for arthralgias and joint swelling.  Skin: Negative for rash.  Neurological: Negative for syncope and headaches.     Social History   Tobacco Use  Smoking Status Current Every Day Smoker  . Packs/day: 0.50  . Types: Cigarettes  Smokeless Tobacco Never Used       Objective:     Wt Readings from Last 3 Encounters:  11/25/18 174 lb 12.8 oz (79.3 kg)  11/10/18 170 lb 12.8 oz (77.5 kg)  04/15/18 145 lb  (65.8 kg)     Exam deferred. Pt. Harboring due to COVID 19. Phone visit performed.   Assessment & Plan:   1. Gastroenteritis, acute   2. PUD (peptic ulcer disease)     Pantoprazole 40 mg daily Ondansetron 8 mg TID    Diagnoses and all orders for this visit:  Gastroenteritis, acute  PUD (peptic ulcer disease)    Virtual Visit via telephone Note  I discussed the limitations, risks, security and privacy concerns of performing an evaluation and management service by telephone and the availability of in person appointments. The patient was identified with two identifiers. Pt.expressed understanding and agreed to proceed. Pt. Is at home. Dr. Darlyn Read is in his office.  Follow Up Instructions:   I discussed the assessment and treatment plan with the patient. The patient was provided an opportunity to ask questions and all were answered. The patient agreed with the plan and demonstrated an understanding of the instructions.   The patient was advised to call back or seek an in-person evaluation if the symptoms worsen or if the condition fails to improve as anticipated.   Total minutes including chart review and phone contact time: 22   Follow up plan: No follow-ups on file.  Mechele Claude, MD [facility]

## 2020-06-20 ENCOUNTER — Encounter: Payer: BC Managed Care – PPO | Admitting: Family Medicine

## 2020-07-13 ENCOUNTER — Encounter: Payer: Self-pay | Admitting: Family Medicine

## 2020-08-10 ENCOUNTER — Encounter: Payer: Self-pay | Admitting: Family Medicine

## 2020-09-21 ENCOUNTER — Encounter: Payer: Self-pay | Admitting: Family Medicine

## 2020-10-20 ENCOUNTER — Encounter: Payer: Self-pay | Admitting: Family Medicine

## 2020-10-20 ENCOUNTER — Ambulatory Visit (INDEPENDENT_AMBULATORY_CARE_PROVIDER_SITE_OTHER): Payer: Managed Care, Other (non HMO) | Admitting: Family Medicine

## 2020-10-20 ENCOUNTER — Telehealth: Payer: Self-pay | Admitting: Family

## 2020-10-20 DIAGNOSIS — Z20822 Contact with and (suspected) exposure to covid-19: Secondary | ICD-10-CM

## 2020-10-20 DIAGNOSIS — R509 Fever, unspecified: Secondary | ICD-10-CM | POA: Diagnosis not present

## 2020-10-20 MED ORDER — PROMETHAZINE-DM 6.25-15 MG/5ML PO SYRP: 5.0000 mL | ORAL_SOLUTION | Freq: Four times a day (QID) | ORAL | 0 refills | Status: DC | PRN

## 2020-10-20 NOTE — Progress Notes (Signed)
E-Visit for Corona Virus Screening  Your current symptoms could be consistent with the coronavirus.  Many health care providers can now test patients at their office but not all are.  [place] has multiple testing sites. For information on our [place] testing locations and hours go to [email or web address]  We are enrolling you in our [place] for [place] . Daily you will receive a questionnaire within the [place] website. Our COVID 19 response team will be monitoring your responses daily.  Testing Information: The COVID-19 Community Testing sites are testing BY APPOINTMENT ONLY.  You can schedule online at [email or web address]  If you do not have access to a smart phone or computer you may call [phone] for an appointment.   Additional testing sites in the Community:  . For [facility] Testing sites in [facility]  FaceUpdate.[email or web address]  . For Pop-up testing sites in [place]  BowlDirectory.co.uk  . For [facility] [email or web address]  . For [facility] testing in [place] and [employer] [email or web address]  . For [facility] testing in [facility]   [email or web address]  For  more information about community testing call [phone]   Please quarantine yourself while awaiting your test results. Please stay home for a minimum of 10 days from the first day of illness with improving symptoms and you have had 24 hours of no fever (without the use of Tylenol (Acetaminophen) Motrin (Ibuprofen) or any fever reducing medication).  Also - Do not get tested prior to returning to work because once you have had a positive test the test can stay  positive for more than a month in some cases.   You should wear a mask or cloth face covering over your nose and mouth if you must be around other people or animals, including pets (even at home). Try to stay at least 6 feet away from other people. This will protect the people around you.  Please continue good preventive care measures, including:  frequent hand-washing, avoid touching your face, cover coughs/sneezes, stay out of crowds and keep a 6 foot distance from others.  COVID-19 is a respiratory illness with symptoms that are similar to the flu. Symptoms are typically mild to moderate, but there have been cases of severe illness and death due to the virus.   The following symptoms may appear 2-14 days after exposure: . Fever . Cough . Shortness of breath or difficulty breathing . Chills . Repeated shaking with chills . Muscle pain . Headache . Sore throat . New loss of taste or smell . Fatigue . Congestion or runny nose . Nausea or vomiting . Diarrhea  Go to the nearest hospital ED for assessment if fever/cough/breathlessness are severe or illness seems like a threat to life.  It is vitally important that if you feel that you have an infection such as this virus or any other virus that you stay home and away from places where you may spread it to others.  You should avoid contact with people age 74 and older.   You can use medication such as A prescription cough medication called Phenergan DM 6.25 mg/15 mg. You make take one teaspoon / 5 ml every 4-6 hours as needed for cough  You may also take acetaminophen (Tylenol) as needed for fever.  Reduce your risk of any infection by using the same precautions used for avoiding the common cold or flu:  Marland Kitchen Wash your hands often with soap and warm water for at least 20 seconds.  If soap  and water are not readily available, use an alcohol-based hand sanitizer with at least 60% alcohol.  . If coughing or sneezing, cover your mouth and nose by  coughing or sneezing into the elbow areas of your shirt or coat, into a tissue or into your sleeve (not your hands). . Avoid shaking hands with others and consider head nods or verbal greetings only. . Avoid touching your eyes, nose, or mouth with unwashed hands.  . Avoid close contact with people who are sick. . Avoid places or events with large numbers of people in one location, like concerts or sporting events. . Carefully consider travel plans you have or are making. . If you are planning any travel outside or inside the [place], visit the CDC's Travelers' Health webpage for the latest health notices. . If you have some symptoms but not all symptoms, continue to monitor at home and seek medical attention if your symptoms worsen. . If you are having a medical emergency, call 911.  HOME CARE . Only take medications as instructed by your medical team. . Drink plenty of fluids and get plenty of rest. . A steam or ultrasonic humidifier can help if you have congestion.   GET HELP RIGHT AWAY IF YOU HAVE EMERGENCY WARNING SIGNS** FOR COVID-19. If you or someone is showing any of these signs seek emergency medical care immediately. Call 911 or proceed to your closest emergency facility if: . You develop worsening high fever. . Trouble breathing . Bluish lips or face . Persistent pain or pressure in the chest . New confusion . Inability to wake or stay awake . You cough up blood. . Your symptoms become more severe  **This list is not all possible symptoms. Contact your medical provider for any symptoms that are sever or concerning to you.  MAKE SURE YOU   Understand these instructions.  Will watch your condition.  Will get help right away if you are not doing well or get worse.  Your e-visit answers were reviewed by a board certified advanced clinical practitioner to complete your personal care plan.  Depending on the condition, your plan could have included both over the counter or  prescription medications.  If there is a problem please reply once you have received a response from your provider.  Your safety is important to [place].  If you have drug allergies check your prescription carefully.    You can use MyChart to ask questions about today's visit, request a non-urgent call back, or ask for a work or school excuse for 24 hours related to this e-Visit. If it has been greater than 24 hours you will need to follow up with your provider, or enter a new e-Visit to address those concerns. You will get an e-mail in the next two days asking about your experience.  I hope that your e-visit has been valuable and will speed your recovery. Thank you for using e-visits.   Greater than 5 minutes, yet less than 10 minutes of time have been spent researching, coordinating, and implementing care for this patient today.  Thank you for the details you included in the comment boxes. Those details are very helpful in determining the best course of treatment for you and help [place] to provide the best care.

## 2020-10-20 NOTE — Progress Notes (Signed)
   Virtual Visit via telephone Note Due to COVID-19 pandemic this visit was conducted virtually via telephone. This visit type was conducted due to national recommendations for restrictions regarding the COVID-19 Pandemic (e.g. social distancing, sheltering in place) in an effort to limit this patient's exposure and mitigate transmission in our community. All issues noted in this document were discussed and addressed.  A physical exam was not performed with this format.  I connected with Victor Reynolds on 10/20/20 at 1256 by telephone and verified that I am speaking with the correct person using two identifiers. Victor Reynolds is currently located at home and his wife is currently with  during visit. The provider, Gabriel Earing, FNP is located in their office at time of visit.  I discussed the limitations, risks, security and privacy concerns of performing an evaluation and management service by telephone and the availability of in person appointments. I also discussed with the patient that there may be a patient responsible charge related to this service. The patient expressed understanding and agreed to proceed.  CC: fever  History and Present Illness:  HPI  Zack reports subjective fever, body aches, chills, headache and fatigue since last night. He reports a mild congestion, sore throat, and congestion as well. Denis loss of taste or smell. He has been taking tylenol and advil with little relief. He denies known exposure to Covid. He denies shortness of breath or chest pain.     ROS As per HPI.   Observations/Objective: Alert and oriented x 3. Able to speak in full sentences without difficulty.    Assessment and Plan: Victor Reynolds was seen today for fever.  Diagnoses and all orders for this visit:  Subjective fever Continue tylenol for fever. Patient will come to office for testing.  -     Novel Coronavirus, NAA ([facility])  Encounter by telehealth for suspected COVID-19 Quarantine  until test results. Mucinex for congestion. OTC cough mediations. Can continue tylenol and advil as needed. Push fluids, rest. Go to ED for chest pain or shortness of breath. Return to office for new or worsening symptoms, or if symptoms persist.  -     Novel Coronavirus, NAA ([facility])   Follow Up Instructions: Return to office for new or worsening symptoms, or if symptoms persist.     I discussed the assessment and treatment plan with the patient. The patient was provided an opportunity to ask questions and all were answered. The patient agreed with the plan and demonstrated an understanding of the instructions.   The patient was advised to call back or seek an in-person evaluation if the symptoms worsen or if the condition fails to improve as anticipated.  The above assessment and management plan was discussed with the patient. The patient verbalized understanding of and has agreed to the management plan. Patient is aware to call the clinic if symptoms persist or worsen. Patient is aware when to return to the clinic for a follow-up visit. Patient educated on when it is appropriate to go to the emergency department.   Time call ended:  1302  Victor Reynolds Better, FNP

## 2020-10-24 LAB — NOVEL CORONAVIRUS, NAA: SARS-CoV-2, NAA: NOT DETECTED

## 2020-10-30 ENCOUNTER — Encounter: Payer: Self-pay | Admitting: Family Medicine

## 2020-10-30 ENCOUNTER — Other Ambulatory Visit: Payer: Self-pay

## 2020-10-30 ENCOUNTER — Ambulatory Visit (INDEPENDENT_AMBULATORY_CARE_PROVIDER_SITE_OTHER): Payer: Managed Care, Other (non HMO) | Admitting: Family Medicine

## 2020-10-30 VITALS — BP 117/78 | HR 84 | Ht 69.0 in | Wt 160.0 lb

## 2020-10-30 DIAGNOSIS — Z23 Encounter for immunization: Secondary | ICD-10-CM

## 2020-10-30 DIAGNOSIS — Z0000 Encounter for general adult medical examination without abnormal findings: Secondary | ICD-10-CM

## 2020-10-30 NOTE — Progress Notes (Signed)
BP 117/78   Pulse 84   Ht 5\' 9"  (1.753 m)   Wt 160 lb (72.6 kg)   SpO2 99%   BMI 23.63 kg/m    Subjective:   Patient ID: Victor Reynolds, male    DOB: 1991/06/29, 30 y.o.   MRN: [id number]  HPI: Victor Reynolds is a 30 y.o. male presenting on 10/30/2020 for Medical Management of Chronic Issues   HPI Adult well exam Patient denies any chest pain, shortness of breath, headaches or vision issues, abdominal complaints, diarrhea, nausea, vomiting, or joint issues.  Denies any major health or medical issues.  Relevant past medical, surgical, family and social history reviewed and updated as indicated. Interim medical history since our last visit reviewed. Allergies and medications reviewed and updated.  Review of Systems  Constitutional: Negative for chills and fever.  HENT: Negative for ear pain and tinnitus.   Eyes: Negative for pain.  Respiratory: Negative for cough, shortness of breath and wheezing.   Cardiovascular: Negative for chest pain, palpitations and leg swelling.  Gastrointestinal: Negative for abdominal pain, blood in stool, constipation and diarrhea.  Genitourinary: Negative for dysuria and hematuria.  Musculoskeletal: Negative for back pain and myalgias.  Skin: Negative for rash.  Neurological: Negative for dizziness, weakness and headaches.  Psychiatric/Behavioral: Negative for suicidal ideas.    Per HPI unless specifically indicated above   Allergies as of 10/30/2020   No Known Allergies     Medication List       Accurate as of October 30, 2020 10:21 AM. If you have any questions, ask your nurse or doctor.        ondansetron 8 MG disintegrating tablet Commonly known as: ZOFRAN-ODT Take 1 tablet (8 mg total) by mouth every 6 (six) hours as needed for nausea or vomiting.   pantoprazole 40 MG tablet Commonly known as: PROTONIX Take 1 tablet (40 mg total) by mouth daily. For stomach   promethazine-dextromethorphan 6.25-15 MG/5ML syrup Commonly known  as: PROMETHAZINE-DM Take 5 mLs by mouth 4 (four) times daily as needed.        Objective:   BP 117/78   Pulse 84   Ht 5\' 9"  (1.753 m)   Wt 160 lb (72.6 kg)   SpO2 99%   BMI 23.63 kg/m   Wt Readings from Last 3 Encounters:  10/30/20 160 lb (72.6 kg)  11/25/18 174 lb 12.8 oz (79.3 kg)  11/10/18 170 lb 12.8 oz (77.5 kg)    Physical Exam Vitals reviewed.  Constitutional:      General: He is not in acute distress.    Appearance: He is well-developed and well-nourished. He is not diaphoretic.  HENT:     Right Ear: External ear normal.     Left Ear: External ear normal.     Nose: Nose normal.     Mouth/Throat:     Mouth: Oropharynx is clear and moist.     Pharynx: No oropharyngeal exudate.  Eyes:     General: No scleral icterus.    Extraocular Movements: EOM normal.     Conjunctiva/sclera: Conjunctivae normal.  Neck:     Thyroid: No thyromegaly.  Cardiovascular:     Rate and Rhythm: Normal rate and regular rhythm.     Pulses: Intact distal pulses.     Heart sounds: Normal heart sounds. No murmur heard.   Pulmonary:     Effort: Pulmonary effort is normal. No respiratory distress.     Breath sounds: Normal breath sounds. No wheezing.  Abdominal:  General: Bowel sounds are normal. There is no distension.     Palpations: Abdomen is soft.     Tenderness: There is no abdominal tenderness. There is no guarding or rebound.  Musculoskeletal:        General: No edema. Normal range of motion.     Cervical back: Neck supple.  Lymphadenopathy:     Cervical: No cervical adenopathy.  Skin:    General: Skin is warm and dry.     Findings: No rash.  Neurological:     Mental Status: He is alert and oriented to person, place, and time.     Coordination: Coordination normal.  Psychiatric:        Mood and Affect: Mood and affect normal.        Behavior: Behavior normal.       Assessment & Plan:   Problem List Items Addressed This Visit   None   Visit Diagnoses     Well adult exam    -  Primary   Need for Tdap vaccination       Relevant Orders   Tdap vaccine greater than or equal to 7yo IM (Completed)       Follow up plan: Return in about 1 year (around 10/30/2021), or if symptoms worsen or fail to improve, for well exam.  Counseling provided for all of the vaccine components Orders Placed This Encounter  Procedures  . Tdap vaccine greater than or equal to 7yo IM    Arville Care, MD [facility] 10/30/2020, 10:21 AM

## 2020-11-20 ENCOUNTER — Encounter: Payer: Self-pay | Admitting: Family

## 2020-11-20 ENCOUNTER — Other Ambulatory Visit: Payer: Self-pay

## 2020-11-20 ENCOUNTER — Ambulatory Visit (INDEPENDENT_AMBULATORY_CARE_PROVIDER_SITE_OTHER): Payer: Managed Care, Other (non HMO) | Admitting: Family

## 2020-11-20 VITALS — BP 127/90 | HR 67 | Temp 97.8°F | Ht 69.0 in | Wt 157.4 lb

## 2020-11-20 DIAGNOSIS — A084 Viral intestinal infection, unspecified: Secondary | ICD-10-CM

## 2020-11-20 MED ORDER — ONDANSETRON 8 MG PO TBDP: 8.0000 mg | ORAL_TABLET | Freq: Four times a day (QID) | ORAL | 1 refills | Status: DC | PRN

## 2020-11-20 NOTE — Progress Notes (Signed)
Subjective:    Patient ID: Victor Reynolds, male    DOB: 06/04/1991, 30 y.o.   MRN: [id number]  Chief Complaint  Patient presents with  . Nausea    Need  Dr. Phoebe Sharps for Saturday and  Sunday    . Diarrhea    Started Friday till day no fever.    Pt presents to the office with GI bug that started Friday- Sunday. He reports he is feeling better, but still not 100%. Mild weakness. He went to work this AM>  Diarrhea  This is a new problem. The current episode started in the past 7 days. The problem occurs more than 10 times per day. The problem has been resolved. The stool consistency is described as watery. Associated symptoms include chills and vomiting. Pertinent negatives include no coughing, fever, headaches or myalgias. Nothing aggravates the symptoms. There are no known risk factors. He has tried change of diet and anti-motility drug for the symptoms. The treatment provided mild relief.  Emesis  This is a new problem. The current episode started in the past 7 days. The problem occurs 5 to 10 times per day. The problem has been resolved. The emesis has an appearance of stomach contents. There has been no fever. Associated symptoms include chills and diarrhea. Pertinent negatives include no coughing, fever, headaches or myalgias.      Review of Systems  Constitutional: Positive for chills. Negative for fever.  Respiratory: Negative for cough.   Gastrointestinal: Positive for diarrhea and vomiting.  Musculoskeletal: Negative for myalgias.  Neurological: Negative for headaches.  All other systems reviewed and are negative.      Objective:   Physical Exam Vitals reviewed.  Constitutional:      General: He is not in acute distress.    Appearance: He is well-developed and well-nourished.  HENT:     Head: Normocephalic.     Mouth/Throat:     Mouth: Oropharynx is clear and moist.  Eyes:     General:        Right eye: No discharge.        Left eye: No discharge.     Pupils:  Pupils are equal, round, and reactive to light.  Neck:     Thyroid: No thyromegaly.  Cardiovascular:     Rate and Rhythm: Normal rate and regular rhythm.     Pulses: Intact distal pulses.     Heart sounds: Normal heart sounds. No murmur heard.   Pulmonary:     Effort: Pulmonary effort is normal. No respiratory distress.     Breath sounds: Normal breath sounds. No wheezing.  Abdominal:     General: There is no distension.     Palpations: Abdomen is soft.     Tenderness: There is no abdominal tenderness.     Comments: Hyperactive   Musculoskeletal:        General: No tenderness or edema. Normal range of motion.     Cervical back: Normal range of motion and neck supple.  Skin:    General: Skin is warm and dry.     Findings: No erythema or rash.  Neurological:     Mental Status: He is alert and oriented to person, place, and time.     Cranial Nerves: No cranial nerve deficit.     Deep Tendon Reflexes: Reflexes are normal and symmetric.  Psychiatric:        Mood and Affect: Mood and affect normal.        Behavior: Behavior normal.  Thought Content: Thought content normal.        Judgment: Judgment normal.       BP 127/90   Pulse 67   Temp 97.8 F (36.6 C) (Temporal)   Ht 5\' 9"  (1.753 m)   Wt 157 lb 6.4 oz (71.4 kg)   BMI 23.24 kg/m      Assessment & Plan:  Victor Reynolds comes in today with chief complaint of Nausea (Need /Dr. Note for Saturday and /Sunday /) and Diarrhea (Started Friday till day no fever. )   Diagnosis and orders addressed:  1. Viral gastroenteritis Bland diet Force fluids Tylenol as needed Zofran as needed Call if symptoms worsen or do not improve  - ondansetron (ZOFRAN-ODT) 8 MG disintegrating tablet; Take 1 tablet (8 mg total) by mouth every 6 (six) hours as needed for nausea or vomiting.  Dispense: 20 tablet; Refill: 1   Saturday, FNP

## 2020-11-20 NOTE — Patient Instructions (Signed)
Viral Gastroenteritis, Adult  Viral gastroenteritis is also known as the stomach flu. This condition may affect your stomach, small intestine, and large intestine. It can cause sudden watery diarrhea, fever, and vomiting. This condition is caused by many different viruses. These viruses can be passed from person to person very easily (are contagious). Diarrhea and vomiting can make you feel weak and cause you to become dehydrated. You may not be able to keep fluids down. Dehydration can make you tired and thirsty, cause you to have a dry mouth, and decrease how often you urinate. It is important to replace the fluids that you lose from diarrhea and vomiting. What are the causes? Gastroenteritis is caused by many viruses, including rotavirus and norovirus. Norovirus is the most common cause in adults. You can get sick after being exposed to the viruses from other people. You can also get sick by:  Eating food, drinking water, or touching a surface contaminated with one of these viruses.  Sharing utensils or other personal items with an infected person. What increases the risk? You are more likely to develop this condition if you:  Have a weak body defense system (immune system).  Live with one or more children who are younger than 2 years old.  Live in a nursing home.  Travel on cruise ships. What are the signs or symptoms? Symptoms of this condition start suddenly 1-3 days after exposure to a virus. Symptoms may last for a few days or for as long as a week. Common symptoms include watery diarrhea and vomiting. Other symptoms include:  Fever.  Headache.  Fatigue.  Pain in the abdomen.  Chills.  Weakness.  Nausea.  Muscle aches.  Loss of appetite. How is this diagnosed? This condition is diagnosed with a medical history and physical exam. You may also have a stool test to check for viruses or other infections. How is this treated? This condition typically goes away on its  own. The focus of treatment is to prevent dehydration and restore lost fluids (rehydration). This condition may be treated with:  An oral rehydration solution (ORS) to replace important salts and minerals (electrolytes) in your body. Take this if told by your health care provider. This is a drink that is sold at pharmacies and retail stores.  Medicines to help with your symptoms.  Probiotic supplements to reduce symptoms of diarrhea.  Fluids given through an IV, if dehydration is severe. Older adults and people with other diseases or a weak immune system are at higher risk for dehydration. Follow these instructions at home: Eating and drinking  Take an ORS as told by your health care provider.  Drink clear fluids in small amounts as you are able. Clear fluids include: ? Water. ? Ice chips. ? Diluted fruit juice. ? Low-calorie sports drinks.  Drink enough fluid to keep your urine pale yellow.  Eat small amounts of healthy foods every 3-4 hours as you are able. This may include whole grains, fruits, vegetables, lean meats, and yogurt.  Avoid fluids that contain a lot of sugar or caffeine, such as energy drinks, sports drinks, and soda.  Avoid spicy or fatty foods.  Avoid alcohol.   General instructions  Wash your hands often, especially after having diarrhea or vomiting. If soap and water are not available, use hand sanitizer.  Make sure that all people in your household wash their hands well and often.  Take over-the-counter and prescription medicines only as told by your health care provider.  Rest at   home while you recover.  Watch your condition for any changes.  Take a warm bath to relieve any burning or pain from frequent diarrhea episodes.  Keep all follow-up visits as told by your health care provider. This is important.   Contact a health care provider if you:  Cannot keep fluids down.  Have symptoms that get worse.  Have new symptoms.  Feel light-headed or  dizzy.  Have muscle cramps. Get help right away if you:  Have chest pain.  Feel extremely weak or you faint.  See blood in your vomit.  Have vomit that looks like coffee grounds.  Have bloody or black stools or stools that look like tar.  Have a severe headache, a stiff neck, or both.  Have a rash.  Have severe pain, cramping, or bloating in your abdomen.  Have trouble breathing or you are breathing very quickly.  Have a fast heartbeat.  Have skin that feels cold and clammy.  Feel confused.  Have pain when you urinate.  Have signs of dehydration, such as: ? Dark urine, very little urine, or no urine. ? Cracked lips. ? Dry mouth. ? Sunken eyes. ? Sleepiness. ? Weakness. Summary  Viral gastroenteritis is also known as the stomach flu. It can cause sudden watery diarrhea, fever, and vomiting.  This condition can be passed from person to person very easily (is contagious).  Take an ORS if told by your health care provider. This is a drink that is sold at pharmacies and retail stores.  Wash your hands often, especially after having diarrhea or vomiting. If soap and water are not available, use hand sanitizer. This information is not intended to replace advice given to you by your health care provider. Make sure you discuss any questions you have with your health care provider. Document Revised: 03/12/2019 Document Reviewed: 07/29/2018 Elsevier Patient Education  2021 Elsevier Inc.  

## 2020-11-30 ENCOUNTER — Ambulatory Visit (INDEPENDENT_AMBULATORY_CARE_PROVIDER_SITE_OTHER): Payer: Managed Care, Other (non HMO) | Admitting: Family Medicine

## 2020-11-30 ENCOUNTER — Encounter: Payer: Self-pay | Admitting: Family Medicine

## 2020-11-30 ENCOUNTER — Other Ambulatory Visit: Payer: Self-pay

## 2020-11-30 VITALS — BP 117/72 | HR 76 | Temp 99.1°F | Ht 69.0 in | Wt 157.6 lb

## 2020-11-30 DIAGNOSIS — R1011 Right upper quadrant pain: Secondary | ICD-10-CM | POA: Diagnosis not present

## 2020-11-30 NOTE — Progress Notes (Signed)
   Assessment & Plan:  1. RUQ pain Patient given a note for light duty at work for the next two days while we complete our workup as he reports he cannot miss work due to his bills. - CBC with Differential/Platelet - CMP14+EGFR - US Abdomen Limited RUQ (LIVER/GB); Future   Follow up plan: Return if symptoms worsen or fail to improve.  Hendricks Limes, MSN, APRN, FNP-C [facility]  Subjective:   Patient ID: Victor Reynolds, male    DOB: [birth year], 30 y.o.   MRN: [id number]  HPI: Victor Reynolds is a 30 y.o. male presenting on 11/30/2020 for Abdominal Pain (Seen Alyse Low 2/14 and went back to work today but still having abd pain. Patient states that he thinks he hurt his self x 1 week ago after picking up wood. )  Patient was seen 10 days ago for nausea, vomiting, and diarrhea at which time he was diagnosed with viral gastroenteritis.  He presents today as he is still having abdominal pain.  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   ROS: Negative unless specifically indicated above in HPI.   Relevant past medical history reviewed and updated as indicated.   Allergies and medications reviewed and updated.  No current outpatient medications on file.  No Known Allergies  Objective:   BP 117/72   Pulse 76   Temp 99.1 F (37.3 C) (Temporal)   Ht $R'5\' 9"'Bp$  (1.753 m)   Wt 157 lb 9.6 oz (71.5 kg)   SpO2 97%   BMI 23.27 kg/m    Physical Exam Vitals reviewed.  Constitutional:      General: He is not in acute distress.    Appearance: Normal appearance. He is normal weight. He is not ill-appearing, toxic-appearing or diaphoretic.  HENT:     Head: Normocephalic and atraumatic.  Eyes:     General: No scleral icterus.       Right eye: No discharge.        Left eye: No discharge.     Conjunctiva/sclera: Conjunctivae normal.  Cardiovascular:     Rate and Rhythm: Normal rate.   Pulmonary:     Effort: Pulmonary effort is normal. No respiratory distress.  Abdominal:     General: Abdomen is flat. Bowel sounds are normal.     Palpations: Abdomen is soft.     Tenderness: There is abdominal tenderness in the right upper quadrant. Positive signs include Murphy's sign.  Musculoskeletal:        General: Normal range of motion.     Cervical back: Normal range of motion.  Skin:    General: Skin is warm and dry.  Neurological:     Mental Status: He is alert and oriented to person, place, and time. Mental status is at baseline.  Psychiatric:        Mood and Affect: Mood normal.        Behavior: Behavior normal.        Thought Content: Thought content normal.        Judgment: Judgment normal.

## 2020-12-01 LAB — CMP14+EGFR
ALT: 35 IU/L (ref 0–44)
AST: 26 IU/L (ref 0–40)
Albumin/Globulin Ratio: 2 (ref 1.2–2.2)
Albumin: 5 g/dL (ref 4.1–5.2)
Alkaline Phosphatase: 97 IU/L (ref 44–121)
BUN/Creatinine Ratio: 20 (ref 9–20)
BUN: 18 mg/dL (ref 6–20)
Bilirubin Total: 0.5 mg/dL (ref 0.0–1.2)
CO2: 22 mmol/L (ref 20–29)
Calcium: 9.8 mg/dL (ref 8.7–10.2)
Chloride: 103 mmol/L (ref 96–106)
Creatinine, Ser: 0.92 mg/dL (ref 0.76–1.27)
GFR calc Af Amer: 129 mL/min/{1.73_m2} (ref >59)
GFR calc non Af Amer: 112 mL/min/{1.73_m2} (ref >59)
Globulin, Total: 2.5 g/dL (ref 1.5–4.5)
Glucose: 75 mg/dL (ref 65–99)
Potassium: 5 mmol/L (ref 3.5–5.2)
Sodium: 142 mmol/L (ref 134–144)
Total Protein: 7.5 g/dL (ref 6.0–8.5)

## 2020-12-01 LAB — CBC WITH DIFFERENTIAL/PLATELET
Basophils Absolute: 0.1 10*3/uL (ref 0.0–0.2)
Basos: 1 %
EOS (ABSOLUTE): 0.6 10*3/uL — ABNORMAL HIGH (ref 0.0–0.4)
Eos: 6 %
Hematocrit: 42.1 % (ref 37.5–51.0)
Hemoglobin: 14.6 g/dL (ref 13.0–17.7)
Immature Grans (Abs): 0 10*3/uL (ref 0.0–0.1)
Immature Granulocytes: 0 %
Lymphocytes Absolute: 3.2 10*3/uL — ABNORMAL HIGH (ref 0.7–3.1)
Lymphs: 35 %
MCH: 29.8 pg (ref 26.6–33.0)
MCHC: 34.7 g/dL (ref 31.5–35.7)
MCV: 86 fL (ref 79–97)
Monocytes Absolute: 0.9 10*3/uL (ref 0.1–0.9)
Monocytes: 10 %
Neutrophils Absolute: 4.5 10*3/uL (ref 1.4–7.0)
Neutrophils: 48 %
Platelets: 193 10*3/uL (ref 150–450)
RBC: 4.9 x10E6/uL (ref 4.14–5.80)
RDW: 12.3 % (ref 11.6–15.4)
WBC: 9.3 10*3/uL (ref 3.4–10.8)

## 2020-12-04 ENCOUNTER — Ambulatory Visit (HOSPITAL_COMMUNITY)
Admission: RE | Admit: 2020-12-04 | Discharge: 2020-12-04 | Disposition: A | Discharge status: Home / Self Care | Payer: Managed Care, Other (non HMO) | Source: Ambulatory Visit | Attending: Family Medicine | Admitting: Family Medicine

## 2020-12-04 ENCOUNTER — Other Ambulatory Visit: Payer: Self-pay

## 2020-12-04 DIAGNOSIS — R1011 Right upper quadrant pain: Secondary | ICD-10-CM | POA: Diagnosis not present

## 2021-05-15 ENCOUNTER — Encounter: Payer: Self-pay | Admitting: Family Medicine

## 2021-05-15 ENCOUNTER — Ambulatory Visit (INDEPENDENT_AMBULATORY_CARE_PROVIDER_SITE_OTHER): Payer: Managed Care, Other (non HMO) | Admitting: Family Medicine

## 2021-05-15 ENCOUNTER — Other Ambulatory Visit: Payer: Self-pay

## 2021-05-15 DIAGNOSIS — R509 Fever, unspecified: Secondary | ICD-10-CM

## 2021-05-15 DIAGNOSIS — R52 Pain, unspecified: Secondary | ICD-10-CM

## 2021-05-15 LAB — VERITOR FLU A/B WAIVED
Influenza A: NEGATIVE
Influenza B: NEGATIVE

## 2021-05-15 NOTE — Progress Notes (Signed)
Virtual Visit via telephone Note Due to COVID-19 pandemic this visit was conducted virtually. This visit type was conducted due to national recommendations for restrictions regarding the COVID-19 Pandemic (e.g. social distancing, sheltering in place) in an effort to limit this patient's exposure and mitigate transmission in our community. All issues noted in this document were discussed and addressed.  A physical exam was not performed with this format.   I connected with Victor Reynolds on 05/15/2021 at 1330 by telephone and verified that I am speaking with the correct person using two identifiers. Victor Reynolds is currently located at home and  no one  is currently with them during visit. The provider, Kari Baars, FNP is located in their office at time of visit.  I discussed the limitations, risks, security and privacy concerns of performing an evaluation and management service by telephone and the availability of in person appointments. I also discussed with the patient that there may be a patient responsible charge related to this service. The patient expressed understanding and agreed to proceed.  Subjective:  Patient ID: Victor Reynolds, male    DOB: 01/07/1991, 30 y.o.   MRN: [id number]  Chief Complaint:  Generalized Body Aches   HPI: Victor Reynolds is a 30 y.o. male presenting on 05/15/2021 for Generalized Body Aches   Pt reports body aches, chills, fever, headache, and fatigue. Onset 2 days ago. Denies known exposure to anyone sick. Has not tried anything for his symptoms other than rest which has been minimally beneficial.     Relevant past medical, surgical, family, and social history reviewed and updated as indicated.  Allergies and medications reviewed and updated.   History reviewed. No pertinent past medical history.  History reviewed. No pertinent surgical history.  Social History   Socioeconomic History   Marital status: Married    Spouse name: Not on file   Number of  children: Not on file   Years of education: Not on file   Highest education level: Not on file  Occupational History   Not on file  Tobacco Use   Smoking status: Every Day    Packs/day: 0.50    Types: Cigarettes   Smokeless tobacco: Never  Vaping Use   Vaping Use: Never used  Substance and Sexual Activity   Alcohol use: Yes    Comment: occasional   Drug use: No   Sexual activity: Not on file  Other Topics Concern   Not on file  Social History Narrative   Not on file   Social Determinants of Health   Financial Resource Strain: Not on file  Food Insecurity: Not on file  Transportation Needs: Not on file  Physical Activity: Not on file  Stress: Not on file  Social Connections: Not on file  Intimate Partner Violence: Not on file    No outpatient encounter medications on file as of 05/15/2021.   No facility-administered encounter medications on file as of 05/15/2021.    No Known Allergies  Review of Systems  Constitutional:  Positive for activity change, appetite change, chills, fatigue and fever. Negative for diaphoresis and unexpected weight change.  HENT:  Positive for congestion. Negative for dental problem, drooling, ear discharge, ear pain, facial swelling, hearing loss, mouth sores, nosebleeds, postnasal drip, rhinorrhea, sinus pressure, sinus pain, sneezing, sore throat, tinnitus, trouble swallowing and voice change.   Eyes: Negative.   Respiratory:  Negative for cough, chest tightness, shortness of breath and wheezing.   Cardiovascular:  Negative for chest pain, palpitations and leg  swelling.  Gastrointestinal:  Negative for abdominal pain, blood in stool, constipation, diarrhea, nausea and vomiting.  Endocrine: Negative.   Genitourinary:  Negative for decreased urine volume, difficulty urinating, dysuria, frequency and urgency.  Musculoskeletal:  Positive for arthralgias and myalgias. Negative for back pain, gait problem, joint swelling, neck pain and neck stiffness.   Skin: Negative.   Allergic/Immunologic: Negative.   Neurological:  Positive for headaches. Negative for dizziness, tremors, seizures, syncope, facial asymmetry, speech difficulty, weakness, light-headedness and numbness.  Hematological: Negative.   Psychiatric/Behavioral:  Negative for confusion, hallucinations, sleep disturbance and suicidal ideas.   All other systems reviewed and are negative.       Observations/Objective: No vital signs or physical exam, this was a telephone or virtual health encounter.  Pt alert and oriented, answers all questions appropriately, and able to speak in full sentences.    Assessment and Plan: Victor Reynolds was seen today for generalized body aches.  Diagnoses and all orders for this visit:  Generalized body aches Fever and chills No known sick contacts. Will test for coronavirus and influenza. Symptomatic care discussed in detail. Pt aware to report any new or worsening symptoms. Work not provided. Will notify pt of test results and additional treatment if warranted.  -     Veritor Flu A/B Waived -     Novel Coronavirus, NAA ([facility]); Future     Follow Up Instructions: Return if symptoms worsen or fail to improve.    I discussed the assessment and treatment plan with the patient. The patient was provided an opportunity to ask questions and all were answered. The patient agreed with the plan and demonstrated an understanding of the instructions.   The patient was advised to call back or seek an in-person evaluation if the symptoms worsen or if the condition fails to improve as anticipated.  The above assessment and management plan was discussed with the patient. The patient verbalized understanding of and has agreed to the management plan. Patient is aware to call the clinic if they develop any new symptoms or if symptoms persist or worsen. Patient is aware when to return to the clinic for a follow-up visit. Patient educated on when it is appropriate  to go to the emergency department.    I provided 12 minutes of non-face-to-face time during this encounter. The call started at 1330. The call ended at 1340. The other time was used for coordination of care.    Kari Baars, FNP-C [facility] [address] [place], [place] [address] [phone] 05/15/2021

## 2021-05-16 LAB — SARS-COV-2, NAA 2 DAY TAT

## 2021-05-16 LAB — NOVEL CORONAVIRUS, NAA: SARS-CoV-2, NAA: DETECTED — AB

## 2021-05-16 NOTE — Progress Notes (Signed)
Note placed at front for pick up and sent to [facility]

## 2021-08-28 IMAGING — US US ABDOMEN LIMITED RUQ/ASCITES
1 series · 14 of 25 positions shown · non-contrast
Comparison: 01/19/2014

CLINICAL DATA: Right upper quadrant pain for 1 week.

EXAM:
ULTRASOUND ABDOMEN LIMITED RIGHT UPPER QUADRANT

[Series 1: us abdomen limited ruq (liver/gb) · 14 of 28 slices shown]
[im 1/28]
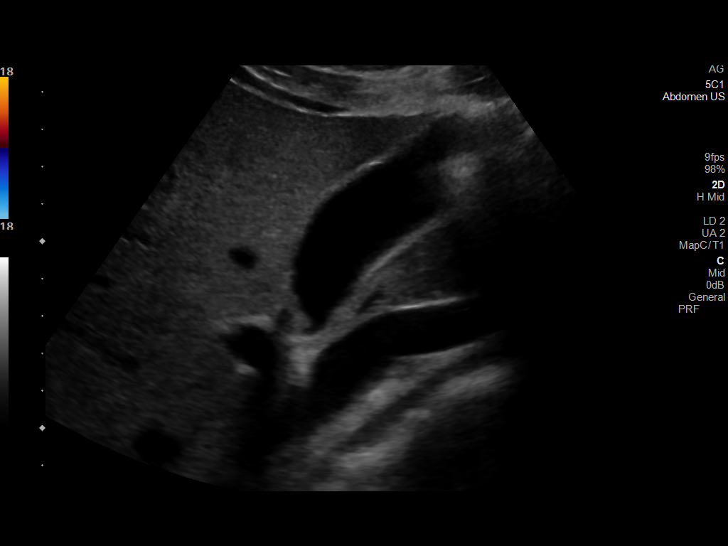
[im 3/28]
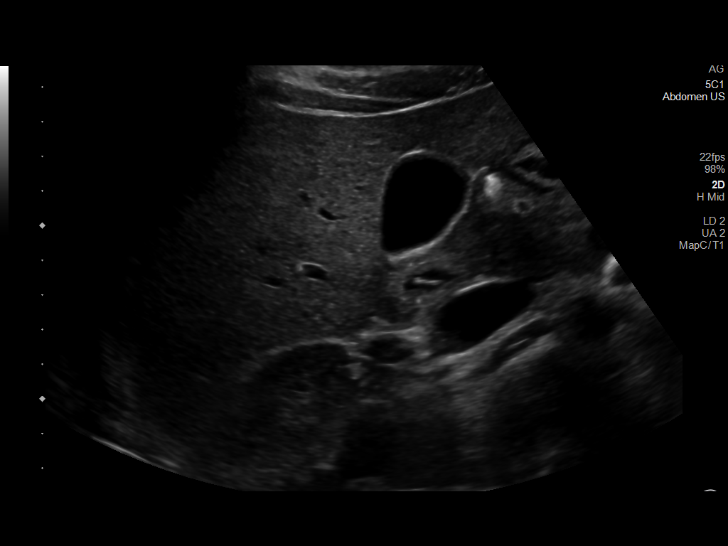
[im 5/28]
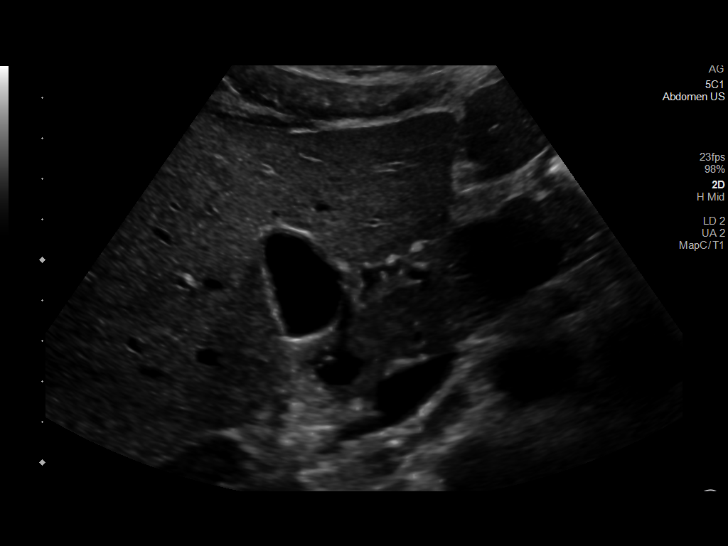
[im 7/28]
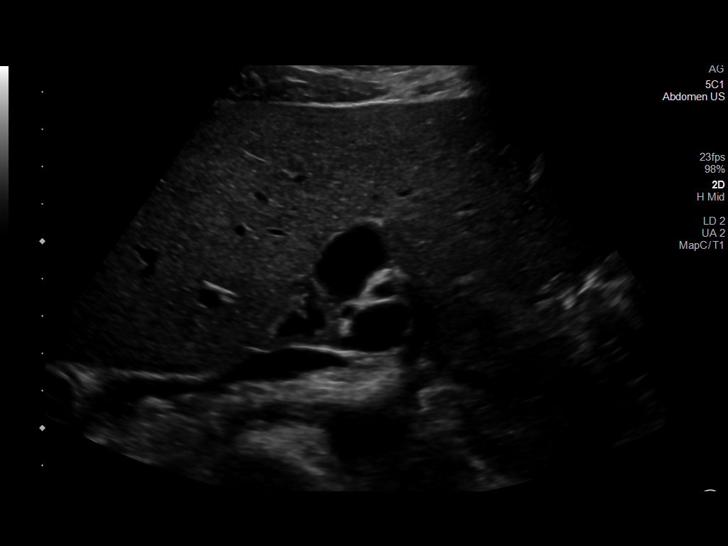
[im 10/28]
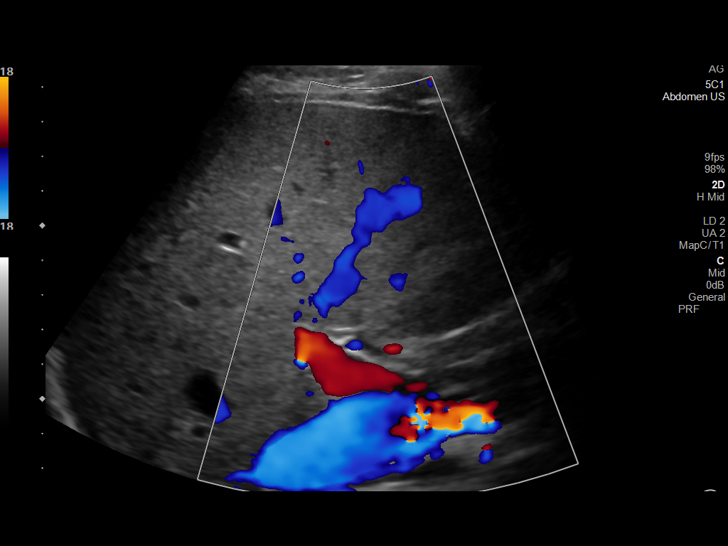
[im 11/28]
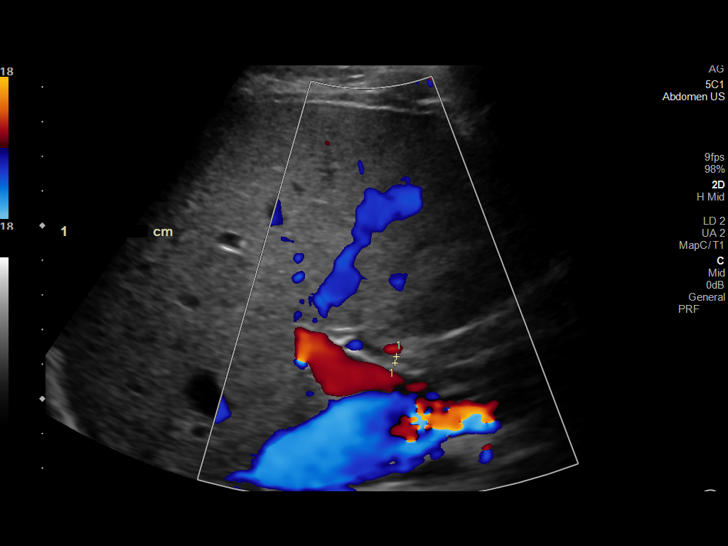
[im 13/28]
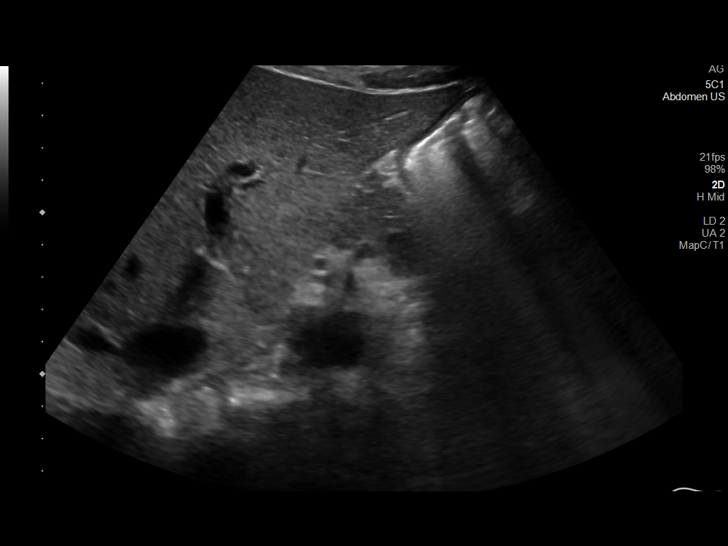
[im 15/28]
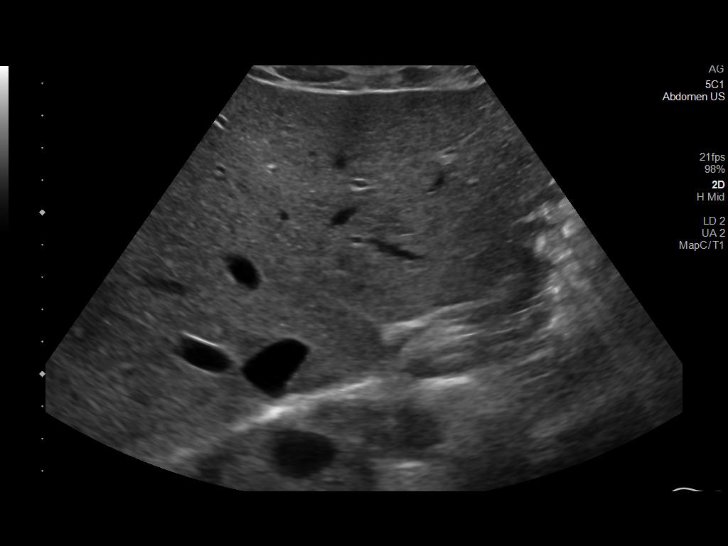
[im 17/28]
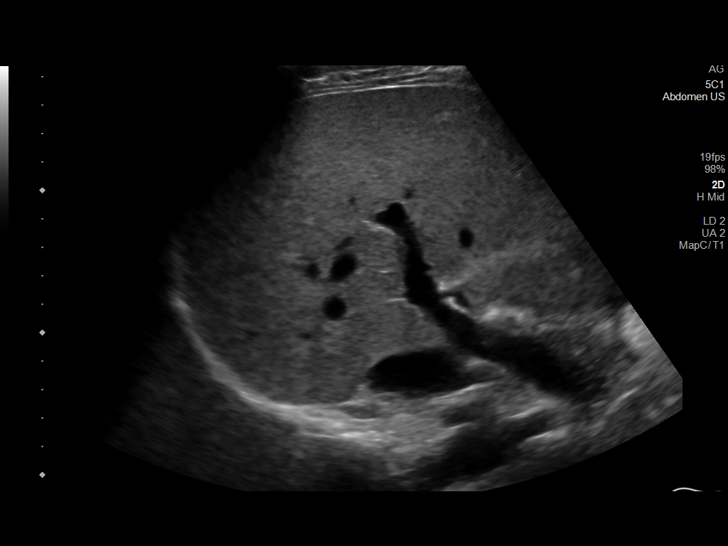
[im 19/28]
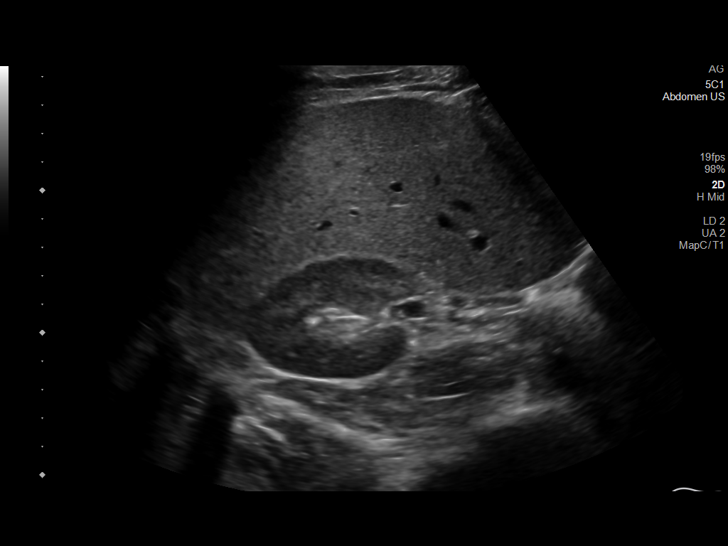
[im 21/28]
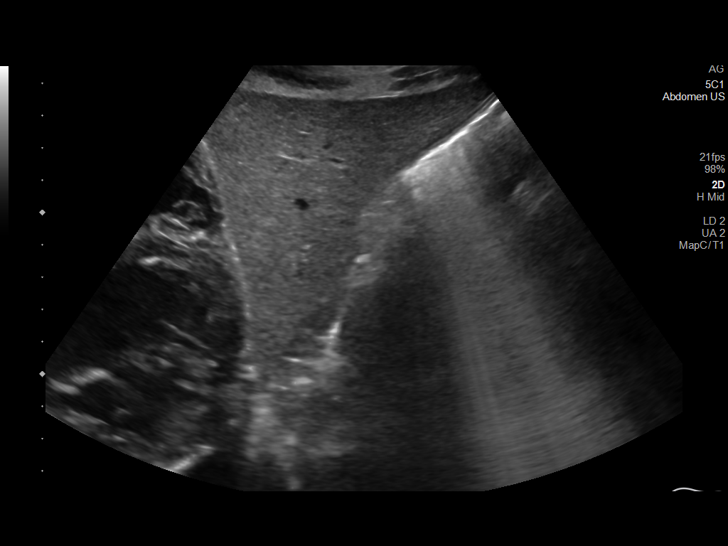
[im 23/28]
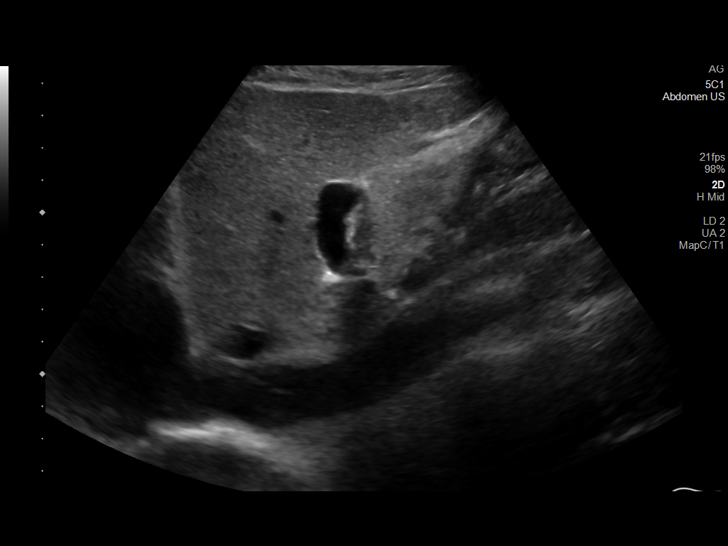
[im 25/28]
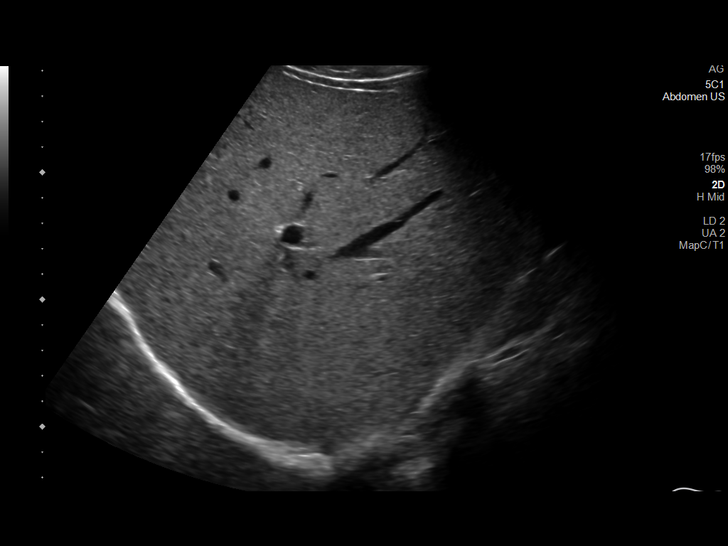
[im 28/28]
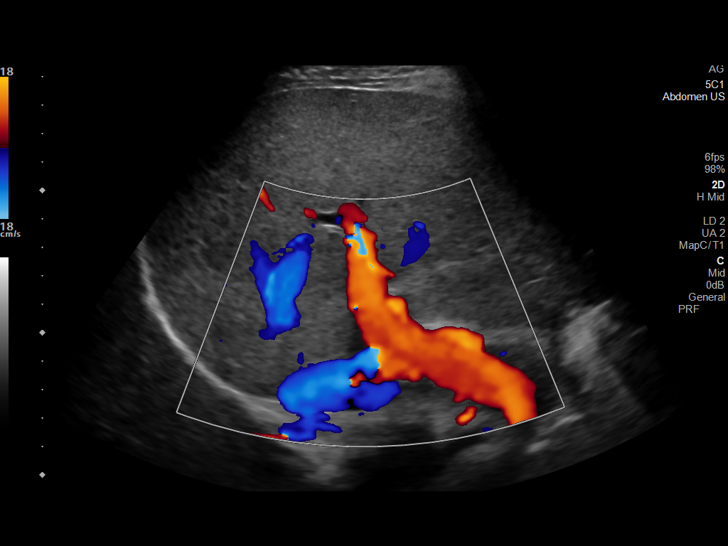

[14 of 25 positions shown; findings below may reference images not displayed]

FINDINGS: Gallbladder:

No gallstones or wall thickening visualized. No sonographic Murphy
sign noted by sonographer.

Common bile duct:

Diameter: 2 mm, within normal limits.

Liver:

No focal lesion identified. Within normal limits in parenchymal
echogenicity. Portal vein is patent on color Doppler imaging with
normal direction of blood flow towards the liver.

Other: None.
IMPRESSION: Negative.  No hepatobiliary abnormality identified.

## 2022-06-06 ENCOUNTER — Ambulatory Visit (INDEPENDENT_AMBULATORY_CARE_PROVIDER_SITE_OTHER): Payer: 59 | Admitting: Family Medicine

## 2022-06-06 ENCOUNTER — Encounter: Payer: Self-pay | Admitting: Family Medicine

## 2022-06-06 VITALS — BP 133/83 | HR 73 | Temp 98.0°F | Ht 69.0 in | Wt 188.0 lb

## 2022-06-06 DIAGNOSIS — Z0000 Encounter for general adult medical examination without abnormal findings: Secondary | ICD-10-CM | POA: Diagnosis not present

## 2022-06-06 DIAGNOSIS — Z1159 Encounter for screening for other viral diseases: Secondary | ICD-10-CM

## 2022-06-06 DIAGNOSIS — Z114 Encounter for screening for human immunodeficiency virus [HIV]: Secondary | ICD-10-CM | POA: Diagnosis not present

## 2022-06-06 NOTE — Progress Notes (Signed)
BP 133/83   Pulse 73   Temp 98 F (36.7 C)   Ht 5' 9"  (1.753 m)   Wt 188 lb (85.3 kg)   SpO2 95%   BMI 27.76 kg/m    Subjective:   Patient ID: Victor Reynolds, male    DOB: [birth year], 31 y.o.   MRN: [id number]  HPI: Victor Reynolds is a 31 y.o. male presenting on 06/06/2022 for Medical Management of Chronic Issues (CPE)   HPI Adult well exam Patient denies any chest pain, shortness of breath, headaches or vision issues, abdominal complaints, diarrhea, nausea, vomiting, or joint issues.  He keeps active and exercises and has the kids that he is running around with and eats healthy and fruits and vegetables.  He stopped smoking at the beginning the year.  Relevant past medical, surgical, family and social history reviewed and updated as indicated. Interim medical history since our last visit reviewed. Allergies and medications reviewed and updated.  Review of Systems  Constitutional:  Negative for chills and fever.  HENT:  Negative for ear pain and tinnitus.   Eyes:  Negative for pain and discharge.  Respiratory:  Negative for cough, shortness of breath and wheezing.   Cardiovascular:  Negative for chest pain, palpitations and leg swelling.  Gastrointestinal:  Negative for abdominal pain, blood in stool, constipation and diarrhea.  Genitourinary:  Negative for dysuria and hematuria.  Musculoskeletal:  Negative for back pain, gait problem and myalgias.  Skin:  Negative for rash.  Neurological:  Negative for dizziness, weakness and headaches.  Psychiatric/Behavioral:  Negative for suicidal ideas.   All other systems reviewed and are negative.   Per HPI unless specifically indicated above   Allergies as of 06/06/2022   No Known Allergies      Medication List    as of June 06, 2022  1:08 PM   You have not been prescribed any medications.      Objective:   BP 133/83   Pulse 73   Temp 98 F (36.7 C)   Ht 5' 9"  (1.753 m)   Wt 188 lb (85.3 kg)   SpO2 95%   BMI  27.76 kg/m   Wt Readings from Last 3 Encounters:  06/06/22 188 lb (85.3 kg)  11/30/20 157 lb 9.6 oz (71.5 kg)  11/20/20 157 lb 6.4 oz (71.4 kg)    Physical Exam Vitals reviewed.  Constitutional:      General: He is not in acute distress.    Appearance: He is well-developed. He is not diaphoretic.  HENT:     Right Ear: External ear normal.     Left Ear: External ear normal.     Nose: Nose normal.     Mouth/Throat:     Pharynx: No oropharyngeal exudate.  Eyes:     General: No scleral icterus.       Right eye: No discharge.     Conjunctiva/sclera: Conjunctivae normal.     Pupils: Pupils are equal, round, and reactive to light.  Neck:     Thyroid: No thyromegaly.  Cardiovascular:     Rate and Rhythm: Normal rate and regular rhythm.     Heart sounds: Normal heart sounds. No murmur heard. Pulmonary:     Effort: Pulmonary effort is normal. No respiratory distress.     Breath sounds: Normal breath sounds. No wheezing.  Abdominal:     General: Bowel sounds are normal. There is no distension.     Palpations: Abdomen is soft.     Tenderness:  There is no abdominal tenderness. There is no guarding or rebound.  Musculoskeletal:        General: Normal range of motion.     Cervical back: Neck supple.  Lymphadenopathy:     Cervical: No cervical adenopathy.  Skin:    General: Skin is warm and dry.     Findings: No rash.  Neurological:     Mental Status: He is alert and oriented to person, place, and time.     Coordination: Coordination normal.  Psychiatric:        Behavior: Behavior normal.       Assessment & Plan:   Problem List Items Addressed This Visit   None Visit Diagnoses     Physical exam    -  Primary   Relevant Orders   CBC with Differential/Platelet   CMP14+EGFR   Lipid panel   Hepatitis C antibody   HIV Antibody (routine testing w rflx)   Need for hepatitis C screening test       Relevant Orders   Hepatitis C antibody   Screening for HIV without presence  of risk factors       Relevant Orders   HIV Antibody (routine testing w rflx)     We will do blood work, seems to be pretty healthy.  Continue with healthy lifestyle  Follow up plan: Return in about 1 year (around 06/07/2023), or if symptoms worsen or fail to improve, for Physical.  Counseling provided for all of the vaccine components Orders Placed This Encounter  Procedures   CBC with Differential/Platelet   CMP14+EGFR   Lipid panel   Hepatitis C antibody   HIV Antibody (routine testing w rflx)    Caryl Pina, MD [facility] 06/06/2022, 1:08 PM

## 2022-06-07 LAB — CMP14+EGFR
ALT: 45 IU/L — ABNORMAL HIGH (ref 0–44)
AST: 30 IU/L (ref 0–40)
Albumin/Globulin Ratio: 2.3 — ABNORMAL HIGH (ref 1.2–2.2)
Albumin: 5.4 g/dL — ABNORMAL HIGH (ref 4.3–5.2)
Alkaline Phosphatase: 83 IU/L (ref 44–121)
BUN/Creatinine Ratio: 20 (ref 9–20)
BUN: 19 mg/dL (ref 6–20)
Bilirubin Total: 0.8 mg/dL (ref 0.0–1.2)
CO2: 24 mmol/L (ref 20–29)
Calcium: 10.2 mg/dL (ref 8.7–10.2)
Chloride: 101 mmol/L (ref 96–106)
Creatinine, Ser: 0.96 mg/dL (ref 0.76–1.27)
Globulin, Total: 2.3 g/dL (ref 1.5–4.5)
Glucose: 58 mg/dL — ABNORMAL LOW (ref 70–99)
Potassium: 4.6 mmol/L (ref 3.5–5.2)
Sodium: 141 mmol/L (ref 134–144)
Total Protein: 7.7 g/dL (ref 6.0–8.5)
eGFR: 109 mL/min/{1.73_m2} (ref >59)

## 2022-06-07 LAB — LIPID PANEL
Chol/HDL Ratio: 4.1 ratio (ref 0.0–5.0)
Cholesterol, Total: 200 mg/dL — ABNORMAL HIGH (ref 100–199)
HDL: 49 mg/dL (ref >39)
LDL Chol Calc (NIH): 131 mg/dL — ABNORMAL HIGH (ref 0–99)
Triglycerides: 111 mg/dL (ref 0–149)
VLDL Cholesterol Cal: 20 mg/dL (ref 5–40)

## 2022-06-07 LAB — CBC WITH DIFFERENTIAL/PLATELET
Basophils Absolute: 0.1 10*3/uL (ref 0.0–0.2)
Basos: 1 %
EOS (ABSOLUTE): 0.2 10*3/uL (ref 0.0–0.4)
Eos: 3 %
Hematocrit: 42.6 % (ref 37.5–51.0)
Hemoglobin: 14.7 g/dL (ref 13.0–17.7)
Immature Grans (Abs): 0 10*3/uL (ref 0.0–0.1)
Immature Granulocytes: 0 %
Lymphocytes Absolute: 2.8 10*3/uL (ref 0.7–3.1)
Lymphs: 37 %
MCH: 29.6 pg (ref 26.6–33.0)
MCHC: 34.5 g/dL (ref 31.5–35.7)
MCV: 86 fL (ref 79–97)
Monocytes Absolute: 0.7 10*3/uL (ref 0.1–0.9)
Monocytes: 9 %
Neutrophils Absolute: 3.8 10*3/uL (ref 1.4–7.0)
Neutrophils: 50 %
Platelets: 220 10*3/uL (ref 150–450)
RBC: 4.97 x10E6/uL (ref 4.14–5.80)
RDW: 13 % (ref 11.6–15.4)
WBC: 7.6 10*3/uL (ref 3.4–10.8)

## 2022-06-07 LAB — HEPATITIS C ANTIBODY: Hep C Virus Ab: NONREACTIVE

## 2022-06-07 LAB — HIV ANTIBODY (ROUTINE TESTING W REFLEX): HIV Screen 4th Generation wRfx: NONREACTIVE

## 2022-09-24 ENCOUNTER — Ambulatory Visit (INDEPENDENT_AMBULATORY_CARE_PROVIDER_SITE_OTHER): Payer: 59 | Admitting: Family Medicine

## 2022-09-24 ENCOUNTER — Encounter: Payer: Self-pay | Admitting: Family Medicine

## 2022-09-24 DIAGNOSIS — J069 Acute upper respiratory infection, unspecified: Secondary | ICD-10-CM

## 2022-09-24 LAB — VERITOR FLU A/B WAIVED
Influenza A: NEGATIVE
Influenza B: NEGATIVE

## 2022-09-24 MED ORDER — DM-GUAIFENESIN ER 30-600 MG PO TB12: 1.0000 | ORAL_TABLET | Freq: Two times a day (BID) | ORAL | 0 refills | Status: DC

## 2022-09-24 NOTE — Progress Notes (Signed)
Virtual Visit via telephone Note Due to COVID-19 pandemic this visit was conducted virtually. This visit type was conducted due to national recommendations for restrictions regarding the COVID-19 Pandemic (e.g. social distancing, sheltering in place) in an effort to limit this patient's exposure and mitigate transmission in our community. All issues noted in this document were discussed and addressed.  A physical exam was not performed with this format.   I connected with Victor Reynolds on 09/24/2022 at 1114 by telephone and verified that I am speaking with the correct person using two identifiers. [employer] is currently located at home and patient is currently with them during visit. The provider, Kari Baars, FNP is located in their office at time of visit.  I discussed the limitations, risks, security and privacy concerns of performing an evaluation and management service by virtual visit and the availability of in person appointments. I also discussed with the patient that there may be a patient responsible charge related to this service. The patient expressed understanding and agreed to proceed.  Subjective:  Patient ID: Victor Reynolds, male    DOB: [birth year], 31 y.o.   MRN: [id number]  Chief Complaint:  URI   HPI: Victor Reynolds is a 31 y.o. male presenting on 09/24/2022 for URI   Pt reports cough, congestion, chills, fever, and myalgias that started yesterday. Negative COVID test at home. No known sick exposures. Has not tried anything for symptoms.   URI  This is a new problem. The current episode started yesterday. The problem has been waxing and waning. Associated symptoms include congestion, coughing, a plugged ear sensation, rhinorrhea and a sore throat. Pertinent negatives include no abdominal pain, chest pain, diarrhea, dysuria, ear pain, headaches, joint pain, joint swelling, nausea, neck pain, rash, sinus pain, sneezing, swollen glands, vomiting or wheezing.      Relevant past medical, surgical, family, and social history reviewed and updated as indicated.  Allergies and medications reviewed and updated.   History reviewed. No pertinent past medical history.  History reviewed. No pertinent surgical history.  Social History   Socioeconomic History   Marital status: Married    Spouse name: Not on file   Number of children: Not on file   Years of education: Not on file   Highest education level: Not on file  Occupational History   Not on file  Tobacco Use   Smoking status: Every Day    Packs/day: 0.50    Types: Cigarettes   Smokeless tobacco: Never  Vaping Use   Vaping Use: Never used  Substance and Sexual Activity   Alcohol use: Yes    Comment: occasional   Drug use: No   Sexual activity: Not on file  Other Topics Concern   Not on file  Social History Narrative   Not on file   Social Determinants of Health   Financial Resource Strain: Not on file  Food Insecurity: Not on file  Transportation Needs: Not on file  Physical Activity: Not on file  Stress: Not on file  Social Connections: Not on file  Intimate Partner Violence: Not on file    Outpatient Encounter Medications as of 09/24/2022  Medication Sig   dextromethorphan-guaiFENesin (MUCINEX DM) 30-600 MG 12hr tablet Take 1 tablet by mouth 2 (two) times daily.   No facility-administered encounter medications on file as of 09/24/2022.    No Known Allergies  Review of Systems  Constitutional:  Positive for activity change, appetite change, chills and fatigue. Negative for diaphoresis, fever and unexpected weight  change.  HENT:  Positive for congestion, postnasal drip, rhinorrhea and sore throat. Negative for dental problem, drooling, ear discharge, ear pain, facial swelling, hearing loss, mouth sores, nosebleeds, sinus pressure, sinus pain, sneezing, tinnitus, trouble swallowing and voice change.   Eyes:  Negative for photophobia and visual disturbance.   Respiratory:  Positive for cough. Negative for apnea, choking, chest tightness, shortness of breath, wheezing and stridor.   Cardiovascular:  Negative for chest pain, palpitations and leg swelling.  Gastrointestinal:  Negative for abdominal pain, diarrhea, nausea and vomiting.  Genitourinary:  Negative for decreased urine volume, difficulty urinating and dysuria.  Musculoskeletal:  Positive for myalgias. Negative for arthralgias, back pain, gait problem, joint pain, joint swelling, neck pain and neck stiffness.  Skin:  Negative for rash.  Neurological:  Negative for weakness and headaches.  Psychiatric/Behavioral:  Negative for confusion.          Observations/Objective: No vital signs or physical exam, this was a virtual health encounter.  Pt alert and oriented, answers all questions appropriately, and able to speak in full sentences.    Assessment and Plan: Victor Reynolds was seen today for uri.  Diagnoses and all orders for this visit:  URI with cough and congestion Influenza and COVID negative. No indications of acute bacterial illness. Will treat symptomatically with below. Increase water intake. Tylenol and Motrin as needed for fever and pain control. Report new, worsening, or persistent symptoms.  -     Veritor Flu A/B Waived -     dextromethorphan-guaiFENesin (MUCINEX DM) 30-600 MG 12hr tablet; Take 1 tablet by mouth 2 (two) times daily.     Follow Up Instructions: Return if symptoms worsen or fail to improve.    I discussed the assessment and treatment plan with the patient. The patient was provided an opportunity to ask questions and all were answered. The patient agreed with the plan and demonstrated an understanding of the instructions.   The patient was advised to call back or seek an in-person evaluation if the symptoms worsen or if the condition fails to improve as anticipated.  The above assessment and management plan was discussed with the patient. The patient  verbalized understanding of and has agreed to the management plan. Patient is aware to call the clinic if they develop any new symptoms or if symptoms persist or worsen. Patient is aware when to return to the clinic for a follow-up visit. Patient educated on when it is appropriate to go to the emergency department.    I provided 15 minutes of time during this telephone encounter.   Monia Pouch, FNP-C [facility] [address] [place], [place] [address] [phone] 09/24/2022

## 2023-02-17 ENCOUNTER — Telehealth (INDEPENDENT_AMBULATORY_CARE_PROVIDER_SITE_OTHER): Payer: 59 | Admitting: Family Medicine

## 2023-02-17 ENCOUNTER — Encounter: Payer: Self-pay | Admitting: Family Medicine

## 2023-02-17 DIAGNOSIS — J020 Streptococcal pharyngitis: Secondary | ICD-10-CM | POA: Diagnosis not present

## 2023-02-17 MED ORDER — ONDANSETRON 4 MG PO TBDP: 4.0000 mg | ORAL_TABLET | Freq: Three times a day (TID) | ORAL | 0 refills | Status: DC | PRN

## 2023-02-17 MED ORDER — CEFDINIR 300 MG PO CAPS: 300.0000 mg | ORAL_CAPSULE | Freq: Two times a day (BID) | ORAL | 0 refills | Status: DC

## 2023-02-17 NOTE — Progress Notes (Signed)
MyChart Video visit  Subjective: CC: Febrile illness PCP: Dettinger, Elige Radon, MD [id number] Voce is a 32 y.o. male. Patient provides verbal consent for consult held via video.  Due to COVID-19 pandemic this visit was conducted virtually. This visit type was conducted due to national recommendations for restrictions regarding the COVID-19 Pandemic (e.g. social distancing, sheltering in place) in an effort to limit this patient's exposure and mitigate transmission in our community. All issues noted in this document were discussed and addressed.  A physical exam was not performed with this format.   Location of patient: home Location of provider: [facility] Others present for call: children/ spouse  1. Febrile illness Patient reports that he started having myalgia, fever (Tmax 101F).  He has had strep exposure from spouse/ children.  He now has sore throat.  He had nausea and vomiting last night.  No rash.   ROS: Per HPI  No Known Allergies No past medical history on file.  Current Outpatient Medications:    dextromethorphan-guaiFENesin (MUCINEX DM) 30-600 MG 12hr tablet, Take 1 tablet by mouth 2 (two) times daily., Disp: 20 tablet, Rfl: 0  Assessment/ Plan: 32 y.o. male   Strep pharyngitis - Plan: ondansetron (ZOFRAN-ODT) 4 MG disintegrating tablet, cefdinir (OMNICEF) 300 MG capsule  We discussed potential for viral mediated illness with influenza versus COVID but given strep exposures suspect that he more likely has that manifestation.  I am going to place him on Omnicef twice daily for the next 10 days.  Zofran given if needed.  Hydrate.  Monitor for signs and symptoms suggestive of advanced streptococcal infection/rheumatic fever.  Follow-up as needed  Start time: 1:33pm End time: 1:38pm  Total time spent on patient care (including video visit/ documentation): 5 minutes  Donie Lemelin Hulen Skains, DO [facility] [phone]

## 2023-06-05 ENCOUNTER — Encounter: Payer: Self-pay | Admitting: Family

## 2023-06-05 ENCOUNTER — Ambulatory Visit: Payer: 59

## 2023-06-05 ENCOUNTER — Telehealth (INDEPENDENT_AMBULATORY_CARE_PROVIDER_SITE_OTHER): Payer: 59 | Admitting: Family

## 2023-06-05 DIAGNOSIS — R109 Unspecified abdominal pain: Secondary | ICD-10-CM | POA: Diagnosis not present

## 2023-06-05 DIAGNOSIS — R110 Nausea: Secondary | ICD-10-CM

## 2023-06-05 DIAGNOSIS — K219 Gastro-esophageal reflux disease without esophagitis: Secondary | ICD-10-CM

## 2023-06-05 MED ORDER — ONDANSETRON HCL 4 MG PO TABS: 4.0000 mg | ORAL_TABLET | Freq: Three times a day (TID) | ORAL | 0 refills | Status: DC | PRN

## 2023-06-05 MED ORDER — OMEPRAZOLE 20 MG PO CPDR: 20.0000 mg | DELAYED_RELEASE_CAPSULE | Freq: Every day | ORAL | 1 refills | Status: DC

## 2023-06-05 NOTE — Progress Notes (Signed)
Virtual Visit Consent   Victor Reynolds, you are scheduled for a virtual visit with a [facility] provider today. Just as with appointments in the office, your consent must be obtained to participate. Your consent will be active for this visit and any virtual visit you may have with one of our providers in the next 365 days. If you have a MyChart account, a copy of this consent can be sent to you electronically.  As this is a virtual visit, video technology does not allow for your provider to perform a traditional examination. This may limit your provider's ability to fully assess your condition. If your provider identifies any concerns that need to be evaluated in person or the need to arrange testing (such as labs, EKG, etc.), we will make arrangements to do so. Although advances in technology are sophisticated, we cannot ensure that it will always work on either your end or our end. If the connection with a video visit is poor, the visit may have to be switched to a telephone visit. With either a video or telephone visit, we are not always able to ensure that we have a secure connection.  By engaging in this virtual visit, you consent to the provision of healthcare and authorize for your insurance to be billed (if applicable) for the services provided during this visit. Depending on your insurance coverage, you may receive a charge related to this service.  I need to obtain your verbal consent now. Are you willing to proceed with your visit today? Victor Reynolds has provided verbal consent on 06/05/2023 for a virtual visit (video or telephone). Victor Rodney, FNP  Date: 06/05/2023 1:33 PM  Virtual Visit via Video Note   I, Victor Reynolds, connected with  Victor Reynolds  ([id number], 31-Jan-1991) on 06/05/23 at  4:30 PM EDT by a video-enabled telemedicine application and verified that I am speaking with the correct person using two identifiers.  Location: Patient: Virtual Visit Location Patient:  Home Provider: Virtual Visit Location Provider: Home Office   I discussed the limitations of evaluation and management by telemedicine and the availability of in person appointments. The patient expressed understanding and agreed to proceed.    History of Present Illness: Victor Reynolds is a 32 y.o. who identifies as a male who was assigned male at birth, and is being seen today for abdominal pain and GERD over the weekend. Reports he has over ate multiple times over the weekend and reported abdominal pain and GERD.   HPI: Gastroesophageal Reflux He complains of abdominal pain and nausea. He reports no dysphagia or no sore throat. This is a new problem. The problem occurs occasionally. The symptoms are aggravated by certain foods.    Problems: There are no problems to display for this patient.   Allergies: No Known Allergies Medications:  Current Outpatient Medications:    omeprazole (PRILOSEC) 20 MG capsule, Take 1 capsule (20 mg total) by mouth daily., Disp: 90 capsule, Rfl: 1   ondansetron (ZOFRAN) 4 MG tablet, Take 1 tablet (4 mg total) by mouth every 8 (eight) hours as needed for nausea or vomiting., Disp: 20 tablet, Rfl: 0   ondansetron (ZOFRAN-ODT) 4 MG disintegrating tablet, Take 1 tablet (4 mg total) by mouth every 8 (eight) hours as needed for nausea or vomiting., Disp: 20 tablet, Rfl: 0  Observations/Objective: Patient is well-developed, well-nourished in no acute distress.  Resting comfortably  at home.  Head is normocephalic, atraumatic.  No labored breathing.  Speech is clear and coherent with  logical content.  Patient is alert and oriented at baseline.  No nausea at this time  Assessment and Plan: 1. Gastroesophageal reflux disease without esophagitis - omeprazole (PRILOSEC) 20 MG capsule; Take 1 capsule (20 mg total) by mouth daily.  Dispense: 90 capsule; Refill: 1  2. Abdominal pain, unspecified abdominal location - ondansetron (ZOFRAN) 4 MG tablet; Take 1 tablet (4  mg total) by mouth every 8 (eight) hours as needed for nausea or vomiting.  Dispense: 20 tablet; Refill: 0  3. Nausea - ondansetron (ZOFRAN) 4 MG tablet; Take 1 tablet (4 mg total) by mouth every 8 (eight) hours as needed for nausea or vomiting.  Dispense: 20 tablet; Refill: 0  -Diet discussed- Avoid fried, spicy, citrus foods, caffeine and alcohol -Do not eat 2-3 hours before bedtime -Encouraged small frequent meals -Avoid NSAID's -Start PPI for next few weeks  Zofran as needed Follow up if symptoms worsen or do not improve   Follow Up Instructions: I discussed the assessment and treatment plan with the patient. The patient was provided an opportunity to ask questions and all were answered. The patient agreed with the plan and demonstrated an understanding of the instructions.  A copy of instructions were sent to the patient via MyChart unless otherwise noted below.    The patient was advised to call back or seek an in-person evaluation if the symptoms worsen or if the condition fails to improve as anticipated.  Time:  I spent 9 minutes with the patient via telehealth technology discussing the above problems/concerns.    Victor Rodney, FNP

## 2023-06-05 NOTE — Patient Instructions (Signed)
 Gastroesophageal Reflux Disease, Adult    Gastroesophageal reflux (GER) happens when acid from the stomach flows up into the tube that connects the mouth and the stomach (esophagus). Normally, food travels down the esophagus and stays in the stomach to be digested. However, when a person has GER, food and stomach acid sometimes move back up into the esophagus. If this becomes a more serious problem, the person may be diagnosed with a disease called gastroesophageal reflux disease (GERD). GERD occurs when the reflux:  Happens often.  Causes frequent or severe symptoms.  Causes problems such as damage to the esophagus.  When stomach acid comes in contact with the esophagus, the acid may cause inflammation in the esophagus. Over time, GERD may create small holes (ulcers) in the lining of the esophagus.  What are the causes?  This condition is caused by a problem with the muscle between the esophagus and the stomach (lower esophageal sphincter, or LES). Normally, the LES muscle closes after food passes through the esophagus to the stomach. When the LES is weakened or abnormal, it does not close properly, and that allows food and stomach acid to go back up into the esophagus.  The LES can be weakened by certain dietary substances, medicines, and medical conditions, including:  Tobacco use.  Pregnancy.  Having a hiatal hernia.  Alcohol use.  Certain foods and beverages, such as coffee, chocolate, onions, and peppermint.  What increases the risk?  You are more likely to develop this condition if you:  Have an increased body weight.  Have a connective tissue disorder.  Take NSAIDs, such as ibuprofen.  What are the signs or symptoms?  Symptoms of this condition include:  Heartburn.  Difficult or painful swallowing and the feeling of having a lump in the throat.  A bitter taste in the mouth.  Bad breath and having a large amount of saliva.  Having an upset or bloated stomach and belching.  Chest pain. Different conditions can  cause chest pain. Make sure you see your health care provider if you experience chest pain.  Shortness of breath or wheezing.  Ongoing (chronic) cough or a nighttime cough.  Wearing away of tooth enamel.  Weight loss.  How is this diagnosed?  This condition may be diagnosed based on a medical history and a physical exam. To determine if you have mild or severe GERD, your health care provider may also monitor how you respond to treatment. You may also have tests, including:  A test to examine your stomach and esophagus with a small camera (endoscopy).  A test that measures the acidity level in your esophagus.  A test that measures how much pressure is on your esophagus.  A barium swallow or modified barium swallow test to show the shape, size, and functioning of your esophagus.  How is this treated?  Treatment for this condition may vary depending on how severe your symptoms are. Your health care provider may recommend:  Changes to your diet.  Medicine.  Surgery.  The goal of treatment is to help relieve your symptoms and to prevent complications.  Follow these instructions at home:  Eating and drinking    Follow a diet as recommended by your health care provider. This may involve avoiding foods and drinks such as:  Coffee and tea, with or without caffeine.  Drinks that contain alcohol.  Energy drinks and sports drinks.  Carbonated drinks or sodas.  Chocolate and cocoa.  Peppermint and mint flavorings.  Garlic and onions.  Horseradish.  Spicy and acidic foods, including peppers, chili powder, curry powder, vinegar, hot sauces, and barbecue sauce.  Citrus fruit juices and citrus fruits, such as oranges, lemons, and limes.  Tomato-based foods, such as red sauce, chili, salsa, and pizza with red sauce.  Fried and fatty foods, such as donuts, french fries, potato chips, and high-fat dressings.  High-fat meats, such as hot dogs and fatty cuts of red and white meats, such as rib eye steak, sausage, ham, and  bacon.  High-fat dairy items, such as whole milk, butter, and cream cheese.  Eat small, frequent meals instead of large meals.  Avoid drinking large amounts of liquid with your meals.  Avoid eating meals during the 2-3 hours before bedtime.  Avoid lying down right after you eat.  Do not exercise right after you eat.  Lifestyle    Do not use any products that contain nicotine or tobacco. These products include cigarettes, chewing tobacco, and vaping devices, such as e-cigarettes. If you need help quitting, ask your health care provider.  Try to reduce your stress by using methods such as yoga or meditation. If you need help reducing stress, ask your health care provider.  If you are overweight, reduce your weight to an amount that is healthy for you. Ask your health care provider for guidance about a safe weight loss goal.  General instructions  Pay attention to any changes in your symptoms.  Take over-the-counter and prescription medicines only as told by your health care provider. Do not take aspirin, ibuprofen, or other NSAIDs unless your health care provider told you to take these medicines.  Wear loose-fitting clothing. Do not wear anything tight around your waist that causes pressure on your abdomen.  Raise (elevate) the head of your bed about 6 inches (15 cm). You can use a wedge to do this.  Avoid bending over if this makes your symptoms worse.  Keep all follow-up visits. This is important.  Contact a health care provider if:  You have:  New symptoms.  Unexplained weight loss.  Difficulty swallowing or it hurts to swallow.  Wheezing or a persistent cough.  A hoarse voice.  Your symptoms do not improve with treatment.  Get help right away if:  You have sudden pain in your arms, neck, jaw, teeth, or back.  You suddenly feel sweaty, dizzy, or light-headed.  You have chest pain or shortness of breath.  You vomit and the vomit is green, yellow, or black, or it looks like blood or coffee grounds.  You faint.  You  have stool that is red, bloody, or black.  You cannot swallow, drink, or eat.  These symptoms may represent a serious problem that is an emergency. Do not wait to see if the symptoms will go away. Get medical help right away. Call your local emergency services (911 in the [place] Do not drive yourself to the hospital.  Summary  Gastroesophageal reflux happens when acid from the stomach flows up into the esophagus. GERD is a disease in which the reflux happens often, causes frequent or severe symptoms, or causes problems such as damage to the esophagus.  Treatment for this condition may vary depending on how severe your symptoms are. Your health care provider may recommend diet and lifestyle changes, medicine, or surgery.  Contact a health care provider if you have new or worsening symptoms.  Take over-the-counter and prescription medicines only as told by your health care provider. Do not take aspirin, ibuprofen, or other NSAIDs  unless your health care provider told you to do so.  Keep all follow-up visits as told by your health care provider. This is important.  This information is not intended to replace advice given to you by your health care provider. Make sure you discuss any questions you have with your health care provider.  Document Revised: 04/01/2020 Document Reviewed: 04/03/2020  Elsevier Patient Education  2024 [employer].

## 2023-06-06 ENCOUNTER — Emergency Department (HOSPITAL_COMMUNITY): Payer: 59

## 2023-06-06 ENCOUNTER — Emergency Department (HOSPITAL_COMMUNITY)
Admission: EM | Admit: 2023-06-06 | Discharge: 2023-06-06 | Disposition: A | Discharge status: Home / Self Care | Payer: 59 | Attending: Emergency Medicine | Admitting: Emergency Medicine

## 2023-06-06 ENCOUNTER — Other Ambulatory Visit: Payer: Self-pay

## 2023-06-06 DIAGNOSIS — R1013 Epigastric pain: Secondary | ICD-10-CM | POA: Diagnosis present

## 2023-06-06 DIAGNOSIS — E860 Dehydration: Secondary | ICD-10-CM | POA: Diagnosis not present

## 2023-06-06 DIAGNOSIS — K2100 Gastro-esophageal reflux disease with esophagitis, without bleeding: Secondary | ICD-10-CM | POA: Diagnosis not present

## 2023-06-06 LAB — RAPID URINE DRUG SCREEN, HOSP PERFORMED
Amphetamines: NOT DETECTED
Barbiturates: NOT DETECTED
Benzodiazepines: NOT DETECTED
Cocaine: NOT DETECTED
Opiates: NOT DETECTED
Tetrahydrocannabinol: POSITIVE — AB

## 2023-06-06 LAB — COMPREHENSIVE METABOLIC PANEL
ALT: 42 U/L (ref 0–44)
AST: 32 U/L (ref 15–41)
Albumin: 4.8 g/dL (ref 3.5–5.0)
Alkaline Phosphatase: 59 U/L (ref 38–126)
Anion gap: 13 (ref 5–15)
BUN: 21 mg/dL — ABNORMAL HIGH (ref 6–20)
CO2: 22 mmol/L (ref 22–32)
Calcium: 9.6 mg/dL (ref 8.9–10.3)
Chloride: 103 mmol/L (ref 98–111)
Creatinine, Ser: 1.17 mg/dL (ref 0.61–1.24)
GFR, Estimated: >60 mL/min (ref >60)
Glucose, Bld: 87 mg/dL (ref 70–99)
Potassium: 4.3 mmol/L (ref 3.5–5.1)
Sodium: 138 mmol/L (ref 135–145)
Total Bilirubin: 2.4 mg/dL — ABNORMAL HIGH (ref 0.3–1.2)
Total Protein: 8 g/dL (ref 6.5–8.1)

## 2023-06-06 LAB — CBC
HCT: 46.3 % (ref 39.0–52.0)
Hemoglobin: 15.5 g/dL (ref 13.0–17.0)
MCH: 29.4 pg (ref 26.0–34.0)
MCHC: 33.5 g/dL (ref 30.0–36.0)
MCV: 87.9 fL (ref 80.0–100.0)
Platelets: 214 10*3/uL (ref 150–400)
RBC: 5.27 MIL/uL (ref 4.22–5.81)
RDW: 12.7 % (ref 11.5–15.5)
WBC: 8.6 10*3/uL (ref 4.0–10.5)
nRBC: 0 % (ref 0.0–0.2)

## 2023-06-06 LAB — URINALYSIS, ROUTINE W REFLEX MICROSCOPIC
Glucose, UA: NEGATIVE mg/dL
Hgb urine dipstick: NEGATIVE
Ketones, ur: 20 mg/dL — AB
Nitrite: NEGATIVE
Protein, ur: 30 mg/dL — AB
Specific Gravity, Urine: 1.034 — ABNORMAL HIGH (ref 1.005–1.030)
pH: 6 (ref 5.0–8.0)

## 2023-06-06 LAB — LIPASE, BLOOD: Lipase: 29 U/L (ref 11–51)

## 2023-06-06 LAB — MAGNESIUM: Magnesium: 2.1 mg/dL (ref 1.7–2.4)

## 2023-06-06 LAB — TROPONIN I (HIGH SENSITIVITY): Troponin I (High Sensitivity): 3 ng/L (ref <18)

## 2023-06-06 MED ORDER — SODIUM CHLORIDE 0.9 % IV BOLUS
1000.0000 mL | Freq: Once | INTRAVENOUS | Status: AC
  Administered 2023-06-06: 1000 mL via INTRAVENOUS

## 2023-06-06 MED ORDER — PANTOPRAZOLE SODIUM 40 MG IV SOLR
40.0000 mg | Freq: Once | INTRAVENOUS | Status: AC
  Administered 2023-06-06: 40 mg via INTRAVENOUS
  Filled 2023-06-06: qty 10

## 2023-06-06 MED ORDER — DICYCLOMINE HCL 20 MG PO TABS: 20.0000 mg | ORAL_TABLET | Freq: Two times a day (BID) | ORAL | 0 refills | Status: DC | PRN

## 2023-06-06 MED ORDER — PANTOPRAZOLE SODIUM 20 MG PO TBEC: 40.0000 mg | DELAYED_RELEASE_TABLET | Freq: Every day | ORAL | 0 refills | Status: DC

## 2023-06-06 NOTE — ED Provider Notes (Signed)
[facility] AT [facility] Provider Note   CSN: [id number] Arrival date & time: 06/06/23  4010     History  Chief Complaint  Patient presents with   Abdominal Pain   Emesis    Victor Reynolds is a 32 y.o. male with past medical history of GERD who presents to the ED complaining of epigastric pain, nausea, and vomiting.  States that he has had similar symptoms for about 5 years now but they have been poorly controlled for 1 to 2 weeks.  Saw his PCP via video visit yesterday and was prescribed omeprazole and Zofran.  Attempted to take those this morning but had vomiting shortly thereafter.  Has been taking famotidine long-term.  Has a known history of GERD.  Has never seen GI.  Had a previous CT scan for symptoms several years ago which was unremarkable.  Last bowel movement was last night was normal.  States that this morning with vomiting it had a small streak of blood and thus a family member prompted him to come to the ED for evaluation.  No large amounts of gross hematemesis, no hematochezia, no melena.  No fever or diarrhea.  Denies alcohol or marijuana use.  No previous abdominal surgeries.  No urinary symptoms.  Epigastric pain radiates into the throat but does not radiate elsewhere in the abdomen or to the back.       Home Medications Prior to Admission medications   Medication Sig Start Date End Date Taking? Authorizing Provider  dicyclomine (BENTYL) 20 MG tablet Take 1 tablet (20 mg total) by mouth 2 (two) times daily as needed for up to 7 days for spasms. 06/06/23 06/13/23 Yes Mikia Delaluz L, PA-C  pantoprazole (PROTONIX) 20 MG tablet Take 2 tablets (40 mg total) by mouth daily for 14 days. 06/06/23 06/20/23 Yes Lenni Reckner L, PA-C  ondansetron (ZOFRAN) 4 MG tablet Take 1 tablet (4 mg total) by mouth every 8 (eight) hours as needed for nausea or vomiting. 06/05/23   Jannifer Rodney A, FNP  ondansetron (ZOFRAN-ODT) 4 MG disintegrating tablet Take 1  tablet (4 mg total) by mouth every 8 (eight) hours as needed for nausea or vomiting. 02/17/23   Raliegh Ip, DO      Allergies    Patient has no known allergies.    Review of Systems   Review of Systems  All other systems reviewed and are negative.   Physical Exam Updated Vital Signs BP 135/82   Pulse (!) 55   Temp 98.1 F (36.7 C) (Oral)   Resp 18   Ht 6' (1.829 m)   Wt 88.5 kg   SpO2 100%   BMI 26.47 kg/m  Physical Exam Vitals and nursing note reviewed.  Constitutional:      General: He is not in acute distress.    Appearance: Normal appearance. He is not ill-appearing or toxic-appearing.  HENT:     Head: Normocephalic and atraumatic.     Mouth/Throat:     Mouth: Mucous membranes are moist.  Eyes:     Conjunctiva/sclera: Conjunctivae normal.  Cardiovascular:     Rate and Rhythm: Normal rate and regular rhythm.     Heart sounds: No murmur heard. Pulmonary:     Effort: Pulmonary effort is normal.     Breath sounds: Normal breath sounds.  Abdominal:     General: Abdomen is flat.     Palpations: Abdomen is soft. There is no pulsatile mass.     Tenderness: There  is abdominal tenderness in the right upper quadrant and epigastric area. There is no right CVA tenderness, left CVA tenderness, guarding or rebound. Negative signs include Murphy's sign, Rovsing's sign and McBurney's sign.  Musculoskeletal:        General: Normal range of motion.     Cervical back: Neck supple.     Right lower leg: No edema.     Left lower leg: No edema.  Skin:    General: Skin is warm and dry.     Capillary Refill: Capillary refill takes less than 2 seconds.  Neurological:     Mental Status: He is alert. Mental status is at baseline.  Psychiatric:        Mood and Affect: Mood normal.        Behavior: Behavior normal.     ED Results / Procedures / Treatments   Labs (all labs ordered are listed, but only abnormal results are displayed) Labs Reviewed  COMPREHENSIVE METABOLIC  PANEL - Abnormal; Notable for the following components:      Result Value   BUN 21 (*)    Total Bilirubin 2.4 (*)    All other components within normal limits  URINALYSIS, ROUTINE W REFLEX MICROSCOPIC - Abnormal; Notable for the following components:   Color, Urine AMBER (*)    APPearance HAZY (*)    Specific Gravity, Urine 1.034 (*)    Bilirubin Urine SMALL (*)    Ketones, ur 20 (*)    Protein, ur 30 (*)    Leukocytes,Ua TRACE (*)    Bacteria, UA FEW (*)    All other components within normal limits  RAPID URINE DRUG SCREEN, HOSP PERFORMED - Abnormal; Notable for the following components:   Tetrahydrocannabinol POSITIVE (*)    All other components within normal limits  LIPASE, BLOOD  CBC  MAGNESIUM  TROPONIN I (HIGH SENSITIVITY)    EKG EKG Interpretation Date/Time:  Friday June 06 2023 11:40:05 EDT Ventricular Rate:  50 PR Interval:  142 QRS Duration:  88 QT Interval:  416 QTC Calculation: 379 R Axis:   52  Text Interpretation: Sinus bradycardia Otherwise normal ECG No previous ECGs available Confirmed by Kristine Royal [phone]) on 06/06/2023 11:42:42 AM  Radiology US Abdomen Limited RUQ (LIVER/GB)  Result Date: 06/06/2023 CLINICAL DATA:  [id number] Epigastric abdominal pain 114841 EXAM: ULTRASOUND ABDOMEN LIMITED RIGHT UPPER QUADRANT COMPARISON:  Ultrasound from 12/04/2020. FINDINGS: Gallbladder: No gallstones or wall thickening visualized. No sonographic Murphy sign noted by sonographer. Common bile duct: Diameter: Up to 6 mm. Liver: No focal lesion identified. Within normal limits in parenchymal echogenicity. Portal vein is patent on color Doppler imaging with normal direction of blood flow towards the liver. Other: None. IMPRESSION: 1. Normal right upper quadrant sonogram. Electronically Signed   By: Jules Schick M.D.   On: 06/06/2023 10:51    Procedures Procedures    Medications Ordered in ED Medications  pantoprazole (PROTONIX) injection 40 mg (40 mg Intravenous  Given 06/06/23 0951)  sodium chloride 0.9 % bolus 1,000 mL (0 mLs Intravenous Stopped 06/06/23 1101)    ED Course/ Medical Decision Making/ A&P                                 Medical Decision Making Amount and/or Complexity of Data Reviewed Labs: ordered. Decision-making details documented in ED Course. Radiology: ordered. Decision-making details documented in ED Course. ECG/medicine tests: ordered. Decision-making details documented in ED Course.  Risk  Prescription drug management.   Medical Decision Making:   Victor Reynolds is a 32 y.o. male who presented to the ED today with abdominal pain detailed above.    Additional history discussed with patient's family/caregivers.  Patient's presentation is complicated by their history of GERD.  Complete initial physical exam performed, notably the patient  was in no acute distress, nontoxic-appearing.  He had mild epigastric and right upper quadrant abdominal tenderness but no rebound, guarding, or peritoneal signs.  No CVA tenderness.    Reviewed and confirmed nursing documentation for past medical history, family history, social history.    Initial Assessment:   With the patient's presentation of abdominal pain, differential diagnosis includes but is not limited to AAA, mesenteric ischemia, appendicitis, diverticulitis, DKA, gastritis, gastroenteritis, AMI, nephrolithiasis, pancreatitis, peritonitis, adrenal insufficiency, intestinal ischemia, constipation, UTI, SBO/LBO, splenic rupture, biliary disease, IBD, IBS, PUD, hepatitis, STD, ovarian/testicular torsion, electrolyte disturbance, DKA, dehydration, acute kidney injury, renal failure, cholecystitis, cholelithiasis, choledocholithiasis, abdominal pain of  unknown etiology.   Initial Plan:  Screening labs including CBC and Metabolic panel to evaluate for infectious or metabolic etiology of disease.  Lipase to evaluate for pancreatitis Urinalysis with reflex culture and UDS ordered to  evaluate for UTI or relevant urologic/nephrologic pathology.  RUQ [place] to evaluate for intra-abdominal pathology EKG and troponin to evaluate for cardiac pathology Symptomatic management Objective evaluation as reviewed   Initial Study Results:   Laboratory  All laboratory results reviewed without evidence of clinically relevant pathology.   Exceptions include: UDS positive for THC  EKG EKG was reviewed independently. ST segments without concerns for elevations.   EKG: sinus bradycardia.  No acute ST-T changes.  Normal EKG otherwise.  No STEMI.  Radiology:  All images reviewed independently. Agree with radiology report at this time.   US Abdomen Limited RUQ (LIVER/GB)  Result Date: 06/06/2023 CLINICAL DATA:  [id number] Epigastric abdominal pain 114841 EXAM: ULTRASOUND ABDOMEN LIMITED RIGHT UPPER QUADRANT COMPARISON:  Ultrasound from 12/04/2020. FINDINGS: Gallbladder: No gallstones or wall thickening visualized. No sonographic Murphy sign noted by sonographer. Common bile duct: Diameter: Up to 6 mm. Liver: No focal lesion identified. Within normal limits in parenchymal echogenicity. Portal vein is patent on color Doppler imaging with normal direction of blood flow towards the liver. Other: None. IMPRESSION: 1. Normal right upper quadrant sonogram. Electronically Signed   By: Jules Schick M.D.   On: 06/06/2023 10:51      Final Assessment and Plan:   32 year old male presents to the ED complaining of epigastric abdominal pain.  Does sound concerning for reflux.  Notes a small amount of blood and an episode of emesis this morning.  Hemoglobin normal.  Patient well-appearing.  Does appear slightly dehydrated so given bolus of fluids.  He does have tenderness to the right upper and epigastric abdomen.  Will obtain ultrasound for further characterization of this.  Normal right upper quadrant ultrasound.  Negative Murphy sign.  Labs normal apart from white cells in urine.  No urinary symptoms, suprapubic  abdominal pain, no fever.  Discussed with patient and will not treat at this time.  Low suspicion for ACS.  Normal troponin, normal EKG.  Patient with symptomatic relief following Protonix and fluids.  As mentioned, do suspect GERD.  Normal lipase, do not suspect pancreatitis.  Could be component related to [facility] use.  Will place on Protonix for 2 weeks and refer to GI for further management.  Patient expressed understanding of plan.  Strict ED return precautions given, all questions answered,  and stable for discharge.   Clinical Impression:  1. Gastroesophageal reflux disease with esophagitis without hemorrhage   2. Dehydration      Discharge           Final Clinical Impression(s) / ED Diagnoses Final diagnoses:  Dehydration  Gastroesophageal reflux disease with esophagitis without hemorrhage    Rx / DC Orders ED Discharge Orders          Ordered    pantoprazole (PROTONIX) 20 MG tablet  Daily        06/06/23 1203    dicyclomine (BENTYL) 20 MG tablet  2 times daily PRN        06/06/23 1203    Ambulatory referral to Gastroenterology        06/06/23 1203              Richardson Dopp 06/06/23 1211    Wynetta Fines, MD 06/06/23 [id number]

## 2023-06-06 NOTE — Discharge Instructions (Addendum)
Thank you for letting us take care of you today.   The ultrasound of your abdomen was normal.  Your blood work was normal.  Your urine had a few white cells in it but since you are not having symptoms we will not treat this.  If you develop burning with urination, blood in your urine, lower abdominal pain, or fever, discuss with your PCP if you should have treatment for this.  I am prescribing the same medication we gave you in the ED to help with your reflux.  Stop the omeprazole as this is a medication that is in the same class.  We recommend taking the Protonix for approximately 2 weeks and then discussing with your PCP or GI if you should continue it.  You may also take the Zofran prescribed by your PCP as needed.  I am prescribing Bentyl as well which you can take for abdominal pain as needed.  With your reflux which has been difficult to control, I am referring you to GI.  They may want to do an endoscopy or other tests to identify the cause of your symptoms.  They should call you within 72 hours to schedule a follow-up appointment.  If you do not hear from them, call their office to schedule this appointment at your convenience.  If you develop new or worsening symptoms, return to the nearest ED for reevaluation.

## 2023-06-06 NOTE — ED Triage Notes (Signed)
Pt with generalized abdominal pain, n/v and mild diarrhea. States this is an ongoing issue but has been worse since Monday. Some blood in vomit this morning.

## 2023-06-11 ENCOUNTER — Encounter: Payer: Self-pay | Admitting: Family Medicine

## 2023-06-11 ENCOUNTER — Ambulatory Visit: Payer: 59 | Admitting: Family Medicine

## 2023-06-11 VITALS — BP 134/78 | HR 60 | Ht 72.0 in | Wt 202.0 lb

## 2023-06-11 DIAGNOSIS — R110 Nausea: Secondary | ICD-10-CM | POA: Diagnosis not present

## 2023-06-11 DIAGNOSIS — K219 Gastro-esophageal reflux disease without esophagitis: Secondary | ICD-10-CM

## 2023-06-11 MED ORDER — SUCRALFATE 1 G PO TABS: 1.0000 g | ORAL_TABLET | Freq: Three times a day (TID) | ORAL | 0 refills | Status: DC

## 2023-06-11 MED ORDER — DICYCLOMINE HCL 20 MG PO TABS
20.0000 mg | ORAL_TABLET | Freq: Two times a day (BID) | ORAL | 1 refills | Status: DC | PRN
Start: 2023-06-11 — End: 2023-06-13

## 2023-06-11 MED ORDER — PANTOPRAZOLE SODIUM 20 MG PO TBEC
40.0000 mg | DELAYED_RELEASE_TABLET | Freq: Every day | ORAL | 1 refills | Status: DC
Start: 2023-06-11 — End: 2023-06-13

## 2023-06-11 NOTE — Progress Notes (Signed)
BP 134/78   Pulse 60   Ht 6' (1.829 m)   Wt 202 lb (91.6 kg)   SpO2 99%   BMI 27.40 kg/m    Subjective:   Patient ID: Victor Reynolds, male    DOB: October 21, 1990, 32 y.o.   MRN: [id number]  HPI: Victor Reynolds is a 32 y.o. male presenting on 06/11/2023 for ER follow up, Abdominal Pain, Diarrhea, and Emesis   HPI Abdominal pain and nausea vomiting Patient is coming in today for an ER follow-up for abdominal pain and nausea vomiting.  He was seen in the ER on 06/06/2023.  They suspected it was GERD related but possibly could be tied into marijuana use.  He says that he has not smoked marijuana in some time but is vaping so there is some concern that there may be cross-contamination with the THC in the vape and he is going to check on the lab.  He is feeling better although he still has some abdominal pain and feels swollen.  His vomiting has improved.  He is taking the Protonix and the Bentyl and they seem to be helping.  He also has gastroenterology referral from the ED.  He is holding down fluids and not getting dehydrated.  Relevant past medical, surgical, family and social history reviewed and updated as indicated. Interim medical history since our last visit reviewed. Allergies and medications reviewed and updated.  Review of Systems  Constitutional:  Negative for chills and fever.  Eyes:  Negative for visual disturbance.  Respiratory:  Negative for shortness of breath and wheezing.   Cardiovascular:  Negative for chest pain and leg swelling.  Gastrointestinal:  Positive for abdominal pain, nausea and vomiting. Negative for constipation and diarrhea.  Skin:  Negative for rash.  All other systems reviewed and are negative.   Per HPI unless specifically indicated above   Allergies as of 06/11/2023   No Known Allergies      Medication List        Accurate as of June 11, 2023 10:00 AM. If you have any questions, ask your nurse or doctor.          STOP taking these  medications    ondansetron 4 MG disintegrating tablet Commonly known as: ZOFRAN-ODT Stopped by: Elige Radon Klein Willcox   ondansetron 4 MG tablet Commonly known as: Zofran Stopped by: Elige Radon Norman Bier       TAKE these medications    dicyclomine 20 MG tablet Commonly known as: BENTYL Take 1 tablet (20 mg total) by mouth 2 (two) times daily as needed for spasms.   pantoprazole 20 MG tablet Commonly known as: PROTONIX Take 2 tablets (40 mg total) by mouth daily.   sucralfate 1 g tablet Commonly known as: Carafate Take 1 tablet (1 g total) by mouth 4 (four) times daily -  with meals and at bedtime. Started by: Elige Radon Miesha Bachmann         Objective:   BP 134/78   Pulse 60   Ht 6' (1.829 m)   Wt 202 lb (91.6 kg)   SpO2 99%   BMI 27.40 kg/m   Wt Readings from Last 3 Encounters:  06/11/23 202 lb (91.6 kg)  06/06/23 195 lb 3.2 oz (88.5 kg)  06/06/22 188 lb (85.3 kg)    Physical Exam Vitals and nursing note reviewed.  Constitutional:      General: He is not in acute distress.    Appearance: He is well-developed. He is not diaphoretic.  Eyes:  General: No scleral icterus.    Conjunctiva/sclera: Conjunctivae normal.  Neck:     Thyroid: No thyromegaly.  Cardiovascular:     Rate and Rhythm: Normal rate and regular rhythm.     Heart sounds: Normal heart sounds. No murmur heard. Pulmonary:     Effort: Pulmonary effort is normal. No respiratory distress.     Breath sounds: Normal breath sounds. No wheezing.  Abdominal:     General: Abdomen is flat. Bowel sounds are normal. There is no distension.     Palpations: Abdomen is soft.     Tenderness: There is abdominal tenderness in the right upper quadrant and epigastric area. There is no guarding or rebound.  Musculoskeletal:        General: No swelling. Normal range of motion.     Cervical back: Neck supple.  Lymphadenopathy:     Cervical: No cervical adenopathy.  Skin:    General: Skin is warm and dry.      Findings: No rash.  Neurological:     Mental Status: He is alert and oriented to person, place, and time.     Coordination: Coordination normal.  Psychiatric:        Behavior: Behavior normal.       Assessment & Plan:   Problem List Items Addressed This Visit   None Visit Diagnoses     Gastroesophageal reflux disease without esophagitis    -  Primary   Relevant Medications   sucralfate (CARAFATE) 1 g tablet   dicyclomine (BENTYL) 20 MG tablet   pantoprazole (PROTONIX) 20 MG tablet   Nausea       Relevant Medications   sucralfate (CARAFATE) 1 g tablet   dicyclomine (BENTYL) 20 MG tablet   pantoprazole (PROTONIX) 20 MG tablet     Concern for possible ulcer and GERD, will keep Protonix and Bentyl going and he can use Zofran as needed.  Will also send a short course of Carafate.   Follow up plan: Return if symptoms worsen or fail to improve.  Counseling provided for all of the vaccine components No orders of the defined types were placed in this encounter.   Arville Care, MD [facility] 06/11/2023, 10:00 AM

## 2023-06-13 ENCOUNTER — Other Ambulatory Visit (HOSPITAL_COMMUNITY): Payer: Self-pay

## 2023-06-13 ENCOUNTER — Telehealth: Payer: Self-pay

## 2023-06-13 ENCOUNTER — Encounter: Payer: Self-pay | Admitting: Gastroenterology

## 2023-06-13 DIAGNOSIS — R110 Nausea: Secondary | ICD-10-CM

## 2023-06-13 DIAGNOSIS — K219 Gastro-esophageal reflux disease without esophagitis: Secondary | ICD-10-CM

## 2023-06-13 MED ORDER — DICYCLOMINE HCL 20 MG PO TABS
20.0000 mg | ORAL_TABLET | Freq: Two times a day (BID) | ORAL | 1 refills | Status: DC | PRN
  Filled 2023-06-13: qty 60, 30d supply, fill #0

## 2023-06-13 MED ORDER — FAMOTIDINE 20 MG PO TABS
20.0000 mg | ORAL_TABLET | Freq: Two times a day (BID) | ORAL | 2 refills | Status: DC
  Filled 2023-06-13: qty 60, 30d supply, fill #0

## 2023-06-13 MED ORDER — PANTOPRAZOLE SODIUM 20 MG PO TBEC
40.0000 mg | DELAYED_RELEASE_TABLET | Freq: Every day | ORAL | 1 refills | Status: DC
  Filled 2023-06-13: qty 60, 30d supply, fill #0

## 2023-06-13 NOTE — Telephone Encounter (Signed)
Patient aware.

## 2023-06-13 NOTE — Telephone Encounter (Signed)
Patient reports that Carafate is making him nauseous and causing vomiting, can something else be sent in instead to [facility].  Also,  meds were sent to [facility] and should have been sent to [facility].  I have re-sent the Bentyl and Protonix to [facility].  The Carafate he had some left from the hospital, that's how he knows it makes him sick.

## 2023-06-13 NOTE — Telephone Encounter (Signed)
Continue Protonix 40 mg daily. Will add Pepcid 20 mg BID. Needs to follow up with GI.   Avoid fried, spicy, citrus foods, caffeine and alcohol -Do not eat 2-3 hours before bedtime -Encouraged small frequent meals -Avoid NSAID's  Jannifer Rodney, FNP

## 2023-06-18 ENCOUNTER — Other Ambulatory Visit (HOSPITAL_COMMUNITY): Payer: Self-pay

## 2023-06-24 ENCOUNTER — Other Ambulatory Visit (HOSPITAL_COMMUNITY): Payer: Self-pay

## 2023-07-17 ENCOUNTER — Telehealth: Payer: Self-pay | Admitting: Family Medicine

## 2023-07-17 DIAGNOSIS — R110 Nausea: Secondary | ICD-10-CM

## 2023-07-17 DIAGNOSIS — K219 Gastro-esophageal reflux disease without esophagitis: Secondary | ICD-10-CM

## 2023-07-17 MED ORDER — PANTOPRAZOLE SODIUM 20 MG PO TBEC
40.0000 mg | DELAYED_RELEASE_TABLET | Freq: Every day | ORAL | 1 refills | Status: DC
Start: 2023-07-17 — End: 2023-11-21

## 2023-07-17 MED ORDER — DICYCLOMINE HCL 20 MG PO TABS: 20.0000 mg | ORAL_TABLET | Freq: Two times a day (BID) | ORAL | 1 refills | Status: AC | PRN

## 2023-07-17 NOTE — Telephone Encounter (Signed)
Refills sent to [facility]

## 2023-07-17 NOTE — Telephone Encounter (Signed)
  Prescription Request  07/17/2023  Is this a "Controlled Substance" medicine? no  Have you seen your PCP in the last 2 weeks? Last appt on 06/11/23  If YES, route message to pool  -  If NO, patient needs to be scheduled for appointment.  What is the name of the medication or equipment? pantoprazole (PROTONIX) 20 MG tablet  dicyclomine (BENTYL) 20 MG table   Have you contacted your pharmacy to request a refill? yes   Which pharmacy would you like this sent to? [facility]    Patient notified that their request is being sent to the clinical staff for review and that they should receive a response within 2 business days.

## 2023-08-28 NOTE — Progress Notes (Signed)
Chief Complaint: Epi pain, N/V  HPI: Patient is a 32 year old Caucasian male with past medical history of GERD who was referred to me on 06/06/2023 following recent ED visit at [facility] for complaints of epigastric pain, nausea, and vomiting.   At that time patient reported he has had the current symptoms for about 5 years, but that the symptoms had increased in severity recently.  Reported he has been taking famotidine long-term for GERD.  Has never been seen by GI.  Had a previous CT scan for symptoms several years ago which was unremarkable.  Normal right upper quadrant ultrasound.  Negative Murphy sign. WBC 8.6. Hgb 15.5, Normal lipase at 29. BUN 21/ Creat 1.17. Normal LFT's. White cells in urine.  Normal troponin and EKG.  Patient was given Protonix and fluids and had relief of symptoms.  It was suspected that there could be a component related to [employer] use.  Patient was discharged and GI referral placed.  On 06/11/2023 patient was seen by his PCP where he stated he was feeling better but still experiencing some abdominal pain and "swelling".  Nausea and vomiting at that time had improved.  He reported that he has not smoked marijuana in some time but is vaping so there was concerns that there Bluford Sedler be cross-contamination with the vape.  He was taking the Protonix and Bentyl which both seem to be helping his symptoms. The PCP had concerns for possible ulcer and GERD therefore he gave him a short course of Carafate.  Interval history:    Patient reports his original symptoms of epigastric pain, nausea, and vomiting have completely resolved. He reports he is currently taking Pantoprazole 40 mg in the morning for heartburn and reflux along with a strict GERD diet, cutting out spicy food.His appetite has improved and he has put on some weight as he has been feeling better. Additionally he is taking the dicyclomine 20 mg twice daily. He has history of intermittent diarrhea and cramping which he  informs me he has not had for some time.  No rectal bleeding. Not taking pepcid or Carafate. Denies nausea, vomiting, or weight loss. Denies dysphagia. Denies THC use, has stopped completely. Denies NSAID use. Reports he does drink socially, very seldom. Reports family history of colon cancer in uncle he states was in his 30-40's. Family history of gastrointestinal issues includes diverticulitis in his father.  Past Medical History:  Diagnosis Date   Abdominal pain    Belching    Diarrhea    GERD (gastroesophageal reflux disease)    Nausea and vomiting    No past surgical history on file.  Current Outpatient Medications  Medication Sig Dispense Refill   dicyclomine (BENTYL) 20 MG tablet Take 1 tablet (20 mg total) by mouth 2 (two) times daily as needed for spasms. 180 tablet 1   sucralfate (CARAFATE) 1 g tablet Take 1 tablet (1 g total) by mouth 4 (four) times daily -  with meals and at bedtime. 21 tablet 0   famotidine (PEPCID) 20 MG tablet Take 1 tablet (20 mg total) by mouth 2 (two) times daily. (Patient not taking: Reported on 08/29/2023) 60 tablet 2   pantoprazole (PROTONIX) 20 MG tablet Take 2 tablets (40 mg total) by mouth daily. (Patient not taking: Reported on 08/29/2023) 180 tablet 1   No current facility-administered medications for this visit.    Allergies as of 08/29/2023   (No Known Allergies)    Family History  Problem Relation Age of Onset  Diabetes Mother    Diverticulitis Paternal Uncle    Colon cancer Paternal Uncle    Colon cancer Paternal Uncle    Esophageal cancer Neg Hx    Stomach cancer Neg Hx    Review of Systems:    Constitutional: No weight loss, fever, chills, weakness or fatigue HEENT: Eyes: No change in vision               Ears, Nose, Throat:  No change in hearing or congestion Skin: No rash or itching Cardiovascular: No chest pain, chest pressure or palpitations   Respiratory: No SOB or cough Gastrointestinal: See HPI and otherwise  negative Genitourinary: No dysuria or change in urinary frequency Neurological: No headache, dizziness or syncope Musculoskeletal: No new muscle or joint pain Hematologic: No bleeding or bruising Psychiatric: No history of depression or anxiety    Physical Exam:  Vital signs: BP 122/70   Pulse 70   Ht 6' (1.829 m)   Wt 201 lb (91.2 kg)   BMI 27.26 kg/m   Constitutional: Pleasant Caucasian male appears to be in NAD, Well developed, Well nourished, alert and cooperative Throat: Oral cavity and pharynx without inflammation, swelling or lesion.  Respiratory: Respirations even and unlabored. Lungs clear to auscultation bilaterally.   No wheezes, crackles, or rhonchi.  Cardiovascular: Normal S1, S2. No MRG. Regular rate and rhythm. No peripheral edema, cyanosis or pallor.  Gastrointestinal:  Soft, nondistended, nontender. No rebound or guarding. Normal bowel sounds. No appreciable masses or hepatomegaly. Rectal:  Not performed.  Skin:   Dry and intact without significant lesions or rashes. Psychiatric: Oriented to person, place and time. Demonstrates good judgement and reason without abnormal affect or behaviors. Denies anxiety or depression.  RELEVANT LABS AND IMAGING:  06/06/2023: US abdomen limited right upper quadrant IMPRESSION: 1. Normal right upper quadrant sonogram.  12/04/2020: US abdomen limited right upper quadrant IMPRESSION: Negative.  No hepatobiliary abnormality identified.  09/28/2018 : CT abd/pelvis No acute findings in the abdomen or pelvis.  Nonvisualization of the appendix.  No Perry cecal inflammatory process noted.  Labs  CBC    Latest Ref Rng & Units 06/06/2023    9:18 AM 06/06/2022    1:10 PM 11/30/2020    4:25 PM  CBC  WBC 4.0 - 10.5 K/uL 8.6  7.6  9.3   Hemoglobin 13.0 - 17.0 g/dL 57.8  46.9  62.9   Hematocrit 39.0 - 52.0 % 46.3  42.6  42.1   Platelets 150 - 400 K/uL 214  220  193     CMP  Lab Results  Component Value Date   NA 138 06/06/2023    K 4.3 06/06/2023   CO2 22 06/06/2023   GLUCOSE 87 06/06/2023   BUN 21 (H) 06/06/2023   CREATININE 1.17 06/06/2023   CALCIUM 9.6 06/06/2023   EGFR 109 06/06/2022   GFRNONAA >60 06/06/2023    Assessment: 32 year old male patient that is following up after recent ED visit for exacerbation of his chronic gastrointestinal issues. His reflux has been better managed with the Pantoprazole 40 mg po daily and GERD diet. We would like to order H. Pylori test and rule out. If neg we will trial titrating him off the PPI therapy. We also discussed staying off the [facility] which can also exacerbate some of his GI issues. The diarrhea and abd cramping is intermittent so we will trial taking him off the scheduled dicyclomine and reevaluate if it returns. The negative imaging and lab work is reassuring there is  no acute disease. He is doing much better today and not experiencing worsening symptoms such as; dysphagia,n/v, or weight loss therefore we discussed holding off on endoscopic procedures. Patient asked about colonoscopy per ACG guidelines we suggest initiating CRC screening with a colonoscopy at age 47 or 10-years for the youngest affected relative which ever is earlier for individuals with CRC or advanced polyps in first-degree relative at age <60 years older CRC or advanced polyp in 2 or greater first-degree relatives at any age.   Encounter Diagnoses  Name Primary?   Abdominal pain, epigastric Yes   Nausea and vomiting, unspecified vomiting type    Heartburn    History of diarrhea      Plan: -Continue avoiding Mariajuana/THC use -Continue GERD diet, no late meals - Order H Pylori urea breath test, has to be off pantoprazole for 5 days - Discontinue the dicyclomine for now and reevaluate for symptoms of diarrhea and/or cramping. -After workup complete if negative for H. pylori can trial changing the pantoprazole to every other day for a few weeks, then discontinue.   Sutton Hirsch, FNP-C [facility] 08/29/2023, 8:59 AM  The APP and I saw this patient together, with the APP acting as my scribe.  I have reviewed the above documentation for accuracy and completeness and I agree with the above documentation.  Henry L. Myrtie Neither, MD   Cc: Dettinger, Elige Radon, MD

## 2023-08-29 ENCOUNTER — Encounter: Payer: Self-pay | Admitting: Gastroenterology

## 2023-08-29 ENCOUNTER — Ambulatory Visit (INDEPENDENT_AMBULATORY_CARE_PROVIDER_SITE_OTHER): Payer: 59 | Admitting: Gastroenterology

## 2023-08-29 VITALS — BP 122/70 | HR 70 | Ht 72.0 in | Wt 201.0 lb

## 2023-08-29 DIAGNOSIS — R112 Nausea with vomiting, unspecified: Secondary | ICD-10-CM | POA: Diagnosis not present

## 2023-08-29 DIAGNOSIS — R1013 Epigastric pain: Secondary | ICD-10-CM

## 2023-08-29 DIAGNOSIS — Z87898 Personal history of other specified conditions: Secondary | ICD-10-CM | POA: Diagnosis not present

## 2023-08-29 DIAGNOSIS — R12 Heartburn: Secondary | ICD-10-CM

## 2023-08-29 NOTE — Patient Instructions (Signed)
You will be going to the [facility] on [address]   [address], [place]  Please follow the instructions below to prepare for your test:   one day prior to your test avoid high gas foods, high fiber goods, fruits and fruit juices, vegetables, beans, cereals, fiber supplements, onion and bell peppers. You may eat hamburger, chicken, beef, tuna fish and eggs.  You should have nothing to eat or drink after 9:00 pm    Do not smoke, chew gum or eat hard candy the morning of your test.  You may not have the test within 2 weeks after taking any antibiotics or within 4 weeks if confirming eradication of H.Pylori.  2 weeks prior to your test stop any Pepto-Bismol and/or proton pump inhibitors (Prevacid, Zegerid, Nexium, Prilosec, Protonix, Aciphex, Dexilant).  You may use over the counter strength Axid, Pepcid, Tagamet and Zantac 2 weeks prior to your tests. However, you should STOP these 24 hours before the test. You may use Tums and Maalox.    discontinue dicyclomine   Trial pantoprazole every other day - if this helps eventually go off of pantoprazole  _______________________________________________________  If your blood pressure at your visit was 140/90 or greater, please contact your primary care physician to follow up on this.  _______________________________________________________  If you are age 64 or older, your body mass index should be between 23-30. Your Body mass index is 27.26 kg/m. If this is out of the aforementioned range listed, please consider follow up with your Primary Care Provider.  If you are age 61 or younger, your body mass index should be between 19-25. Your Body mass index is 27.26 kg/m. If this is out of the aformentioned range listed, please consider follow up with your Primary Care Provider.   ________________________________________________________  The [facility] providers would like to encourage you to use [facility] to  communicate with providers for non-urgent requests or questions.  Due to long hold times on the telephone, sending your provider a message by [facility] may be a faster and more efficient way to get a response.  Please allow 48 business hours for a response.  Please remember that this is for non-urgent requests.  _______________________________________________________ It was a pleasure to see you today!  Thank you for trusting me with your gastrointestinal care!

## 2023-11-21 ENCOUNTER — Telehealth: Payer: 59 | Admitting: Family

## 2023-11-21 ENCOUNTER — Encounter: Payer: Self-pay | Admitting: Family

## 2023-11-21 DIAGNOSIS — A084 Viral intestinal infection, unspecified: Secondary | ICD-10-CM

## 2023-11-21 MED ORDER — ONDANSETRON HCL 4 MG PO TABS: 4.0000 mg | ORAL_TABLET | Freq: Three times a day (TID) | ORAL | 0 refills | Status: AC | PRN

## 2023-11-21 NOTE — Progress Notes (Signed)
Virtual Visit Consent   Victor Reynolds, you are scheduled for a virtual visit with a [facility] provider today. Just as with appointments in the office, your consent must be obtained to participate. Your consent will be active for this visit and any virtual visit you may have with one of our providers in the next 365 days. If you have a MyChart account, a copy of this consent can be sent to you electronically.  As this is a virtual visit, video technology does not allow for your provider to perform a traditional examination. This may limit your provider's ability to fully assess your condition. If your provider identifies any concerns that need to be evaluated in person or the need to arrange testing (such as labs, EKG, etc.), we will make arrangements to do so. Although advances in technology are sophisticated, we cannot ensure that it will always work on either your end or our end. If the connection with a video visit is poor, the visit may have to be switched to a telephone visit. With either a video or telephone visit, we are not always able to ensure that we have a secure connection.  By engaging in this virtual visit, you consent to the provision of healthcare and authorize for your insurance to be billed (if applicable) for the services provided during this visit. Depending on your insurance coverage, you may receive a charge related to this service.  I need to obtain your verbal consent now. Are you willing to proceed with your visit today? Victor Reynolds has provided verbal consent on 11/21/2023 for a virtual visit (video or telephone). Jannifer Rodney, FNP  Date: 11/21/2023 1:50 PM   Virtual Visit via Video Note   I, Jannifer Rodney, connected with  Victor Reynolds  (Victor Reynolds, Victor Reynolds) on 11/21/23 at  1:55 PM EST by a video-enabled telemedicine application and verified that I am speaking with the correct person using two identifiers.  Location: Patient: Virtual Visit Location Patient:  Home Provider: Virtual Visit Location Provider: Home Office   I discussed the limitations of evaluation and management by telemedicine and the availability of in person appointments. The patient expressed understanding and agreed to proceed.    History of Present Illness: Victor Reynolds is a 33 y.o. who identifies as a male who was assigned male at birth, and is being seen today for vomiting and diarrhea that started yesterday.  HPI: Emesis  This is a new problem. The current episode started yesterday. The problem occurs 5 to 10 times per day. The problem has been unchanged. The emesis has an appearance of stomach contents. There has been no fever. Associated symptoms include diarrhea. Pertinent negatives include no chills, coughing, fever, headaches or myalgias. He has tried bed rest for the symptoms. The treatment provided mild relief.    Problems: There are no active problems to display for this patient.   Allergies: No Known Allergies Medications:  Current Outpatient Medications:    ondansetron (ZOFRAN) 4 MG tablet, Take 1 tablet (4 mg total) by mouth every 8 (eight) hours as needed for nausea or vomiting., Disp: 20 tablet, Rfl: 0   dicyclomine (BENTYL) 20 MG tablet, Take 1 tablet (20 mg total) by mouth 2 (two) times daily as needed for spasms., Disp: 180 tablet, Rfl: 1   sucralfate (CARAFATE) 1 g tablet, Take 1 tablet (1 g total) by mouth 4 (four) times daily -  with meals and at bedtime., Disp: 21 tablet, Rfl: 0  Observations/Objective: Patient is well-developed, well-nourished in no  acute distress.  Resting comfortably  at home.  Head is normocephalic, atraumatic.  No labored breathing.  Speech is clear and coherent with logical content.  Patient is alert and oriented at baseline.    Assessment and Plan: 1. Viral gastroenteritis (Primary) - ondansetron (ZOFRAN) 4 MG tablet; Take 1 tablet (4 mg total) by mouth every 8 (eight) hours as needed for nausea or vomiting.  Dispense: 20  tablet; Refill: 0  Force fluids Bland diet  Tylenol as needed  Zofran as needed Work note given  Follow up if symptoms worsen or do not improve   Follow Up Instructions: I discussed the assessment and treatment plan with the patient. The patient was provided an opportunity to ask questions and all were answered. The patient agreed with the plan and demonstrated an understanding of the instructions.  A copy of instructions were sent to the patient via MyChart unless otherwise noted below.     The patient was advised to call back or seek an in-person evaluation if the symptoms worsen or if the condition fails to improve as anticipated.    Jannifer Rodney, FNP

## 2024-02-25 ENCOUNTER — Encounter: Payer: Self-pay | Admitting: Family Medicine

## 2024-09-16 ENCOUNTER — Other Ambulatory Visit: Payer: Self-pay

## 2024-09-22 ENCOUNTER — Other Ambulatory Visit: Payer: Self-pay

## 2024-09-22 ENCOUNTER — Emergency Department (HOSPITAL_COMMUNITY): Payer: Self-pay

## 2024-09-22 ENCOUNTER — Encounter (HOSPITAL_COMMUNITY): Payer: Self-pay | Admitting: *Deleted

## 2024-09-22 ENCOUNTER — Emergency Department (HOSPITAL_COMMUNITY)
Admission: EM | Admit: 2024-09-22 | Discharge: 2024-09-22 | Disposition: A | Discharge status: Home / Self Care | Payer: Self-pay | Attending: Emergency Medicine | Admitting: Emergency Medicine

## 2024-09-22 DIAGNOSIS — R1013 Epigastric pain: Secondary | ICD-10-CM | POA: Insufficient documentation

## 2024-09-22 DIAGNOSIS — D72829 Elevated white blood cell count, unspecified: Secondary | ICD-10-CM | POA: Insufficient documentation

## 2024-09-22 DIAGNOSIS — R110 Nausea: Secondary | ICD-10-CM

## 2024-09-22 DIAGNOSIS — K219 Gastro-esophageal reflux disease without esophagitis: Secondary | ICD-10-CM

## 2024-09-22 LAB — COMPREHENSIVE METABOLIC PANEL WITH GFR
ALT: 37 U/L (ref 0–44)
AST: 28 U/L (ref 15–41)
Albumin: 5 g/dL (ref 3.5–5.0)
Alkaline Phosphatase: 99 U/L (ref 38–126)
Anion gap: 16 — ABNORMAL HIGH (ref 5–15)
BUN: 21 mg/dL — ABNORMAL HIGH (ref 6–20)
CO2: 21 mmol/L — ABNORMAL LOW (ref 22–32)
Calcium: 9.8 mg/dL (ref 8.9–10.3)
Chloride: 102 mmol/L (ref 98–111)
Creatinine, Ser: 0.91 mg/dL (ref 0.61–1.24)
GFR, Estimated: >60 mL/min (ref >60)
Glucose, Bld: 89 mg/dL (ref 70–99)
Potassium: 4.3 mmol/L (ref 3.5–5.1)
Sodium: 140 mmol/L (ref 135–145)
Total Bilirubin: 1.1 mg/dL (ref 0.0–1.2)
Total Protein: 8.1 g/dL (ref 6.5–8.1)

## 2024-09-22 LAB — CBC
HCT: 48.9 % (ref 39.0–52.0)
Hemoglobin: 16.4 g/dL (ref 13.0–17.0)
MCH: 29.1 pg (ref 26.0–34.0)
MCHC: 33.5 g/dL (ref 30.0–36.0)
MCV: 86.9 fL (ref 80.0–100.0)
Platelets: 208 K/uL (ref 150–400)
RBC: 5.63 MIL/uL (ref 4.22–5.81)
RDW: 12.9 % (ref 11.5–15.5)
WBC: 13.8 K/uL — ABNORMAL HIGH (ref 4.0–10.5)
nRBC: 0 % (ref 0.0–0.2)

## 2024-09-22 LAB — LIPASE, BLOOD: Lipase: 28 U/L (ref 11–51)

## 2024-09-22 MED ORDER — OMEPRAZOLE 20 MG PO CPDR: 20.0000 mg | DELAYED_RELEASE_CAPSULE | Freq: Every day | ORAL | 0 refills | Status: AC

## 2024-09-22 MED ORDER — SUCRALFATE 1 G PO TABS: 1.0000 g | ORAL_TABLET | Freq: Three times a day (TID) | ORAL | 0 refills | Status: AC

## 2024-09-22 NOTE — ED Triage Notes (Signed)
 Pt states he has been vomiting blood and having bloody stools since last night; pt had one episode of vomiting blood over the weekend but it had resolved until last night

## 2024-09-22 NOTE — Discharge Instructions (Signed)
 Today's evaluation has been generally reassuring.  However, take your prescribed medication as directed and be sure to follow-up with a gastroenterologist.  If you elect to follow-up in [place], the contact information for another of our gastroenterologist is available.  Return here for concerning changes in your condition.

## 2024-09-22 NOTE — ED Notes (Signed)
 Pt reports he has been dealing w/ GI issues for a while.  Sts he was seen by GI in [place] and is supposed to be having a breath test.  Pt reports he has not been contacted about the test and cannot get anyone to return his call.

## 2024-09-22 NOTE — ED Provider Notes (Signed)
 [place] EMERGENCY DEPARTMENT AT [facility] Provider Note   CSN: [id number] Arrival date & time: 09/22/24  9190     Patient presents with: Abdominal Pain   Victor Reynolds is a 33 y.o. male.   HPI Patient presents with concern of abdominal pain.  Patient has had abdominal pain for a long time, with intermittent exacerbations. Over the past days patient has had intermittent blood in his stool as well as questionable hematemesis.  Most recent defecation is prior to my evaluation without blood, no current nausea, nor vomiting, though he describes abdominal pain worse in the upper abdomen. Patient smokes, he is generally otherwise well. He has not been able to follow-up with GI thus far.     Prior to Admission medications  Medication Sig Start Date End Date Taking? Authorizing Provider  omeprazole  (PRILOSEC) 20 MG capsule Take 1 capsule (20 mg total) by mouth daily. Take one tablet daily 09/22/24  Yes Garrick Charleston, MD  dicyclomine  (BENTYL ) 20 MG tablet Take 1 tablet (20 mg total) by mouth 2 (two) times daily as needed for spasms. 07/17/23   Dettinger, Fonda LABOR, MD  ondansetron  (ZOFRAN ) 4 MG tablet Take 1 tablet (4 mg total) by mouth every 8 (eight) hours as needed for nausea or vomiting. 11/21/23   Lavell Lye A, FNP  sucralfate  (CARAFATE ) 1 g tablet Take 1 tablet (1 g total) by mouth 4 (four) times daily -  with meals and at bedtime. 09/22/24   Garrick Charleston, MD    Allergies: Patient has no known allergies.    Review of Systems  Updated Vital Signs BP (!) 126/90   Pulse 72   Temp 98.3 F (36.8 C) (Oral)   Resp 16   Ht 1.829 m (6')   Wt 88.5 kg   SpO2 98%   BMI 26.45 kg/m   Physical Exam Vitals and nursing note reviewed.  Constitutional:      General: He is not in acute distress.    Appearance: He is well-developed.  HENT:     Head: Normocephalic and atraumatic.  Eyes:     Conjunctiva/sclera: Conjunctivae normal.  Cardiovascular:     Rate and  Rhythm: Normal rate and regular rhythm.  Pulmonary:     Effort: Pulmonary effort is normal. No respiratory distress.     Breath sounds: No stridor.  Abdominal:     General: There is no distension.     Tenderness: There is abdominal tenderness in the epigastric area.  Skin:    General: Skin is warm and dry.  Neurological:     Mental Status: He is alert and oriented to person, place, and time.     (all labs ordered are listed, but only abnormal results are displayed) Labs Reviewed  COMPREHENSIVE METABOLIC PANEL WITH GFR - Abnormal; Notable for the following components:      Result Value   CO2 21 (*)    BUN 21 (*)    Anion gap 16 (*)    All other components within normal limits  CBC - Abnormal; Notable for the following components:   WBC 13.8 (*)    All other components within normal limits  LIPASE, BLOOD    EKG: None  Radiology: US  Abdomen Limited RUQ (LIVER/GB) Result Date: 09/22/2024 CLINICAL DATA:  [id number] Abdominal pain [id number] EXAM: ULTRASOUND ABDOMEN LIMITED RIGHT UPPER QUADRANT COMPARISON:  06/06/2023 FINDINGS: Gallbladder: No gallstones. No wall thickening (measuring 2 mm, by my measurement) or pericholecystic fluid. No sonographic Murphy's sign noted by sonographer. Common  bile duct: Diameter: 3 mm Liver: Normal echogenicity. No focal lesion identified. No intrahepatic biliary ductal dilation. Portal vein is patent on color Doppler imaging with normal direction of blood flow towards the liver. Right Kidney: Partially visualized. No mass. No hydronephrosis or nephrolithiasis. Other: None. IMPRESSION: No cholecystolithiasis or changes of acute cholecystitis. Electronically Signed   By: Rogelia Myers M.D.   On: 09/22/2024 10:11     Procedures   Medications Ordered in the ED - No data to display                                  Medical Decision Making Generally well-appearing young adult male, hemodynamically unremarkable presents with abdominal pain, focal tenderness  on exam, episodic hematemesis, per report as well as blood in stool. Patient has been seen, evaluated in the past, is awaiting additional follow-up. Prior differential including gastroesophageal irritation, GI bleed, peptic ulcer disease, though no evidence for perforation, no tenderness palpation of the substantial.  Hepatobiliary dysfunction considered. Pulse ox 90% room air normal  Amount and/or Complexity of Data Reviewed Independent Historian: spouse Labs: ordered. Decision-making details documented in ED Course. Radiology: ordered and independent interpretation performed. Decision-making details documented in ED Course.  Risk Prescription drug management.   On repeat exam patient in no distress, no complaints beyond being slightly cold.  He is hemodynamic unremarkable, ultrasound reviewed, labs reviewed, all within normal limits aside from mild leukocytosis, noted., patient had multiple resources for GI follow-up provided here.  With mild leukocytosis, mild elevated BUN, suspicion for gastroesophageal irritation, patient started appropriate meds in this regard.     Final diagnoses:  Epigastric pain    ED Discharge Orders          Ordered    sucralfate  (CARAFATE ) 1 g tablet  3 times daily with meals & bedtime        09/22/24 1108    omeprazole  (PRILOSEC) 20 MG capsule  Daily        09/22/24 1108               Garrick Charleston, MD 09/22/24 1111

## 2024-09-22 NOTE — ED Notes (Signed)
 Pink top held in Lab.

## 2024-09-22 NOTE — ED Notes (Signed)
 Pt verbalized understanding of discharge instructions. Opportunity for questions provided.

## 2024-09-22 NOTE — ED Notes (Signed)
 ED Provider at bedside.

## 2024-09-23 ENCOUNTER — Telehealth: Payer: Self-pay | Admitting: Family Medicine

## 2024-09-23 NOTE — Telephone Encounter (Signed)
 Was asked to look into his ER visit and the good thing is even though he had some blood in his stool his blood counts were still good, he does not show any signs of anemia.  I do agree with the ER doctor to take the sucralfate  and omeprazole  and then to get into gastroenterology ASAP.  If he has trouble getting a visit to see gastroenterology then let us  know.

## 2024-09-23 NOTE — Telephone Encounter (Signed)
 Left message of Dr. Williemae recommendations on pts cell. Advised to call back if needed.
